# Patient Record
Sex: Female | Born: 1965 | Race: White | Hispanic: No | Smoking: Current every day smoker
Health system: Southern US, Community
[De-identification: ages and names within clinical notes are randomized; demographics above are authoritative.]

## PROBLEM LIST (undated history)

## (undated) DIAGNOSIS — F32A Depression, unspecified: Secondary | ICD-10-CM

## (undated) DIAGNOSIS — G5600 Carpal tunnel syndrome, unspecified upper limb: Secondary | ICD-10-CM

## (undated) DIAGNOSIS — F419 Anxiety disorder, unspecified: Secondary | ICD-10-CM

## (undated) DIAGNOSIS — K760 Fatty (change of) liver, not elsewhere classified: Secondary | ICD-10-CM

## (undated) DIAGNOSIS — I10 Essential (primary) hypertension: Secondary | ICD-10-CM

## (undated) DIAGNOSIS — I2510 Atherosclerotic heart disease of native coronary artery without angina pectoris: Secondary | ICD-10-CM

## (undated) DIAGNOSIS — M1990 Unspecified osteoarthritis, unspecified site: Secondary | ICD-10-CM

## (undated) DIAGNOSIS — F329 Major depressive disorder, single episode, unspecified: Secondary | ICD-10-CM

## (undated) DIAGNOSIS — E119 Type 2 diabetes mellitus without complications: Secondary | ICD-10-CM

## (undated) DIAGNOSIS — R011 Cardiac murmur, unspecified: Secondary | ICD-10-CM

## (undated) HISTORY — DX: Atherosclerotic heart disease of native coronary artery without angina pectoris: I25.10

## (undated) HISTORY — PX: CHOLECYSTECTOMY: SHX55

## (undated) HISTORY — PX: TUBAL LIGATION: SHX77

---

## 2015-08-12 DIAGNOSIS — Z139 Encounter for screening, unspecified: Secondary | ICD-10-CM

## 2015-10-22 NOTE — Congregational Nurse Program (Signed)
Congregational Nurse Program Note  Date of Encounter: 08/12/2015  Past Medical History: No past medical history on file.  Encounter Details:  Client [facility] 08/27/15 2:00, transportation arranged through [facility].  Client has multiple health issues and need a medical provider.  BP 139/90  P-  81. Pearletha AlfredJan Mak,RN CNP [phone]

## 2016-02-11 ENCOUNTER — Encounter (HOSPITAL_COMMUNITY): Payer: Self-pay | Admitting: Emergency Medicine

## 2016-02-11 ENCOUNTER — Emergency Department (HOSPITAL_COMMUNITY)
Admission: EM | Admit: 2016-02-11 | Discharge: 2016-02-11 | Disposition: A | Discharge status: Home / Self Care | Payer: Self-pay | Attending: Emergency Medicine | Admitting: Emergency Medicine

## 2016-02-11 DIAGNOSIS — Z79899 Other long term (current) drug therapy: Secondary | ICD-10-CM | POA: Insufficient documentation

## 2016-02-11 DIAGNOSIS — N939 Abnormal uterine and vaginal bleeding, unspecified: Secondary | ICD-10-CM | POA: Insufficient documentation

## 2016-02-11 DIAGNOSIS — Z7982 Long term (current) use of aspirin: Secondary | ICD-10-CM | POA: Insufficient documentation

## 2016-02-11 DIAGNOSIS — I10 Essential (primary) hypertension: Secondary | ICD-10-CM | POA: Insufficient documentation

## 2016-02-11 DIAGNOSIS — B9689 Other specified bacterial agents as the cause of diseases classified elsewhere: Secondary | ICD-10-CM

## 2016-02-11 DIAGNOSIS — N760 Acute vaginitis: Secondary | ICD-10-CM | POA: Insufficient documentation

## 2016-02-11 DIAGNOSIS — F17210 Nicotine dependence, cigarettes, uncomplicated: Secondary | ICD-10-CM | POA: Insufficient documentation

## 2016-02-11 DIAGNOSIS — N390 Urinary tract infection, site not specified: Secondary | ICD-10-CM | POA: Insufficient documentation

## 2016-02-11 DIAGNOSIS — Z791 Long term (current) use of non-steroidal anti-inflammatories (NSAID): Secondary | ICD-10-CM | POA: Insufficient documentation

## 2016-02-11 HISTORY — DX: Essential (primary) hypertension: I10

## 2016-02-11 HISTORY — DX: Depression, unspecified: F32.A

## 2016-02-11 HISTORY — DX: Anxiety disorder, unspecified: F41.9

## 2016-02-11 HISTORY — DX: Carpal tunnel syndrome, unspecified upper limb: G56.00

## 2016-02-11 HISTORY — DX: Major depressive disorder, single episode, unspecified: F32.9

## 2016-02-11 LAB — CBC WITH DIFFERENTIAL/PLATELET
BASOS PCT: 0 %
Basophils Absolute: 0 10*3/uL (ref 0.0–0.1)
EOS ABS: 0.2 10*3/uL (ref 0.0–0.7)
Eosinophils Relative: 2 %
HEMATOCRIT: 36.7 % (ref 36.0–46.0)
Hemoglobin: 12.5 g/dL (ref 12.0–15.0)
Lymphocytes Relative: 29 %
Lymphs Abs: 2.7 10*3/uL (ref 0.7–4.0)
MCH: 32.7 pg (ref 26.0–34.0)
MCHC: 34.1 g/dL (ref 30.0–36.0)
MCV: 96.1 fL (ref 78.0–100.0)
MONOS PCT: 8 %
Monocytes Absolute: 0.7 10*3/uL (ref 0.1–1.0)
NEUTROS PCT: 61 %
Neutro Abs: 5.6 10*3/uL (ref 1.7–7.7)
Platelets: 277 10*3/uL (ref 150–400)
RBC: 3.82 MIL/uL — AB (ref 3.87–5.11)
RDW: 12.9 % (ref 11.5–15.5)
WBC: 9.3 10*3/uL (ref 4.0–10.5)

## 2016-02-11 LAB — URINALYSIS, ROUTINE W REFLEX MICROSCOPIC
BILIRUBIN URINE: NEGATIVE
GLUCOSE, UA: NEGATIVE mg/dL
HGB URINE DIPSTICK: NEGATIVE
Ketones, ur: NEGATIVE mg/dL
Leukocytes, UA: NEGATIVE
Nitrite: NEGATIVE
PH: 6 (ref 5.0–8.0)
Protein, ur: NEGATIVE mg/dL
SPECIFIC GRAVITY, URINE: 1.02 (ref 1.005–1.030)

## 2016-02-11 LAB — COMPREHENSIVE METABOLIC PANEL
ALBUMIN: 3.8 g/dL (ref 3.5–5.0)
ALT: 26 U/L (ref 14–54)
ANION GAP: 8 (ref 5–15)
AST: 26 U/L (ref 15–41)
Alkaline Phosphatase: 39 U/L (ref 38–126)
BILIRUBIN TOTAL: 0.4 mg/dL (ref 0.3–1.2)
BUN: 10 mg/dL (ref 6–20)
CO2: 26 mmol/L (ref 22–32)
Calcium: 9.6 mg/dL (ref 8.9–10.3)
Chloride: 102 mmol/L (ref 101–111)
Creatinine, Ser: 0.88 mg/dL (ref 0.44–1.00)
GLUCOSE: 96 mg/dL (ref 65–99)
POTASSIUM: 3.7 mmol/L (ref 3.5–5.1)
Sodium: 136 mmol/L (ref 135–145)
TOTAL PROTEIN: 6.9 g/dL (ref 6.5–8.1)

## 2016-02-11 LAB — RAPID STREP SCREEN (MED CTR MEBANE ONLY): Streptococcus, Group A Screen (Direct): NEGATIVE

## 2016-02-11 LAB — WET PREP, GENITAL
Sperm: NONE SEEN
TRICH WET PREP: NONE SEEN
YEAST WET PREP: NONE SEEN

## 2016-02-11 LAB — PREGNANCY, URINE: Preg Test, Ur: NEGATIVE

## 2016-02-11 MED ORDER — ACETAMINOPHEN 500 MG PO TABS
1000.0000 mg | ORAL_TABLET | Freq: Once | ORAL | Status: AC
  Administered 2016-02-11: 1000 mg via ORAL
  Filled 2016-02-11: qty 2

## 2016-02-11 MED ORDER — NAPROXEN 250 MG PO TABS: 250.0000 mg | ORAL_TABLET | Freq: Two times a day (BID) | ORAL | 0 refills | Status: DC | PRN

## 2016-02-11 MED ORDER — METRONIDAZOLE 500 MG PO TABS
500.0000 mg | ORAL_TABLET | Freq: Once | ORAL | Status: AC
  Administered 2016-02-11: 500 mg via ORAL
  Filled 2016-02-11: qty 1

## 2016-02-11 MED ORDER — METRONIDAZOLE 500 MG PO TABS: 500.0000 mg | ORAL_TABLET | Freq: Two times a day (BID) | ORAL | 0 refills | Status: DC

## 2016-02-11 MED ORDER — IBUPROFEN 400 MG PO TABS
400.0000 mg | ORAL_TABLET | Freq: Once | ORAL | Status: AC
  Administered 2016-02-11: 400 mg via ORAL
  Filled 2016-02-11: qty 1

## 2016-02-11 NOTE — Discharge Instructions (Signed)
Take the prescriptions as directed.  Also take over the counter tylenol, as directed on packaging, as needed for discomfort.  Gargle with warm water several times per day to help with discomfort.  May also use over the counter sore throat pain medicines such as chloraseptic or sucrets, as directed on packaging, as needed for discomfort.  Call your regular medical and OB/GYN doctor tomorrow to schedule a follow up appointment within the next week.  Return to the Emergency Department immediately sooner if worsening.

## 2016-02-11 NOTE — ED Triage Notes (Signed)
Pt reports heavy vaginal bleeding, body aches, fever, headache,sore throat and carpal tunnel problems. NAD noted

## 2016-02-11 NOTE — ED Provider Notes (Signed)
[facility]-EMERGENCY DEPT Provider Note   CSN: [id number] Arrival date & time: 02/11/16  1717     History   Chief Complaint Chief Complaint  Patient presents with  . Vaginal Bleeding  . Flu like symptoms    HPI Renee Reilly is a 50 y.o. female.  HPI  Pt was seen at 2030. Per pt, c/o gradual onset and persistence of constant "heavy" vaginal bleeding for the past 2 weeks.  Denies pelvic pain, no dysuria/hematuria, no flank pain, no N/V/D, no fevers, no rash. Pt also c/o  Per pt, c/o gradual onset and persistence of constant sore throat, runny/stuffy nose, sinus congestion, and generalized body aches for the past few days.  Denies objective fevers, no rash, no CP/SOB, no cough.    Past Medical History:  Diagnosis Date  . Anxiety   . Carpal tunnel syndrome   . Depression   . Hypertension     There are no active problems to display for this patient.   Past Surgical History:  Procedure Laterality Date  . TUBAL LIGATION      OB History    Gravida Para Term Preterm AB Living   3 2 2   1      SAB TAB Ectopic Multiple Live Births   1               Home Medications    Prior to Admission medications   Medication Sig Start Date End Date Taking? Authorizing Provider  aspirin 81 MG chewable tablet Chew 81 mg by mouth daily.   Yes Historical Provider, MD  ibuprofen (ADVIL,MOTRIN) 800 MG tablet Take 800 mg by mouth every 8 (eight) hours as needed.   Yes Historical Provider, MD  lisinopril (PRINIVIL,ZESTRIL) 10 MG tablet Take 10 mg by mouth daily.   Yes Historical Provider, MD  promethazine (PHENERGAN) 25 MG tablet Take 25 mg by mouth every 6 (six) hours as needed for nausea or vomiting.   Yes Historical Provider, MD    Family History Family History  Problem Relation Age of Onset  . Cancer Mother   . Cancer Other     Social History Social History  Substance Use Topics  . Smoking status: Current Every Day Smoker    Packs/day: 0.25    Types: Cigarettes  . Smokeless  tobacco: Never Used  . Alcohol use Yes     Comment: occas     Allergies   Penicillins   Review of Systems Review of Systems ROS: Statement: All systems negative except as marked or noted in the HPI; Constitutional: Negative for fever and chills.+generalized body aches.; ; Eyes: Negative for eye pain, redness and discharge. ; ; ENMT: Negative for ear pain, hoarseness, +nasal congestion, sinus pressure and sore throat. ; ; Cardiovascular: Negative for chest pain, palpitations, diaphoresis, dyspnea and peripheral edema. ; ; Respiratory: Negative for cough, wheezing and stridor. ; ; Gastrointestinal: Negative for nausea, vomiting, diarrhea, abdominal pain, blood in stool, hematemesis, jaundice and rectal bleeding. . ; ; Genitourinary: Negative for dysuria, flank pain and hematuria. ; ; GYN:  No pelvic pain, +vaginal bleeding, no vaginal discharge, no vulvar pain. ;; Musculoskeletal: Negative for back pain and neck pain. Negative for swelling and trauma.; ; Skin: Negative for pruritus, rash, abrasions, blisters, bruising and skin lesion.; ; Neuro: Negative for headache, lightheadedness and neck stiffness. Negative for weakness, altered level of consciousness, altered mental status, extremity weakness, paresthesias, involuntary movement, seizure and syncope.      Physical Exam Updated Vital Signs BP 120/70 (  BP Location: Left Arm)   Pulse 87   Temp 97.6 F (36.4 C) (Oral)   Resp 20   Ht 5\' 6"  (1.676 m)   Wt 209 lb (94.8 kg)   LMP 02/11/2016   SpO2 97%   BMI 33.73 kg/m   Physical Exam 2035: Physical examination:  Nursing notes reviewed; Vital signs and O2 SAT reviewed;  Constitutional: Well developed, Well nourished, Well hydrated, In no acute distress; Head:  Normocephalic, atraumatic; Eyes: EOMI, PERRL, No scleral icterus; ENMT: TM's clear bilat. +edemetous nasal turbinates bilat with clear rhinorrhea. Mouth and pharynx without lesions. No tonsillar exudates. No intra-oral edema. No  submandibular or sublingual edema. No hoarse voice, no drooling, no stridor. No pain with manipulation of larynx. No trismus. Mouth and pharynx normal, Mucous membranes moist; Neck: Supple, Full range of motion, No lymphadenopathy; Cardiovascular: Regular rate and rhythm, No murmur, rub, or gallop; Respiratory: Breath sounds clear & equal bilaterally, No rales, rhonchi, wheezes.  Speaking full sentences with ease, Normal respiratory effort/excursion; Chest: Nontender, Movement normal; Abdomen: Soft, Nontender, Nondistended, Normal bowel sounds; Genitourinary: No CVA tenderness. Pelvic exam performed with permission of pt and female ED tech assist during exam.  External genitalia w/o lesions. Vaginal vault with small amount white discharge, no blood.  Cervix w/o lesions, not friable, GC/chlam and wet prep obtained and sent to lab.  Bimanual exam w/o CMT or adnexal tenderness. +mild suprapubic tenderness.;;; Extremities: Pulses normal, No tenderness, No edema, No calf edema or asymmetry.; Neuro: AA&Ox3, Major CN grossly intact.  Speech clear. No gross focal motor or sensory deficits in extremities.; Skin: Color normal, Warm, Dry.   ED Treatments / Results  Labs (all labs ordered are listed, but only abnormal results are displayed)   EKG  EKG Interpretation None       Radiology   Procedures Procedures (including critical care time)  Medications Ordered in ED Medications - No data to display   Initial Impression / Assessment and Plan / ED Course  I have reviewed the triage vital signs and the nursing notes.  Pertinent labs & imaging results that were available during my care of the patient were reviewed by me and considered in my medical decision making (see chart for details).  MDM Reviewed: previous chart, nursing note and vitals Interpretation: labs   Results for orders placed or performed during the hospital encounter of 02/11/16  Wet prep, genital  Result Value Ref Range    Yeast Wet Prep HPF POC NONE SEEN NONE SEEN   Trich, Wet Prep NONE SEEN NONE SEEN   Clue Cells Wet Prep HPF POC PRESENT (A) NONE SEEN   WBC, Wet Prep HPF POC MODERATE (A) NONE SEEN   Sperm NONE SEEN   Rapid strep screen  Result Value Ref Range   Streptococcus, Group A Screen (Direct) NEGATIVE NEGATIVE  CBC with Differential  Result Value Ref Range   WBC 9.3 4.0 - 10.5 K/uL   RBC 3.82 (L) 3.87 - 5.11 MIL/uL   Hemoglobin 12.5 12.0 - 15.0 g/dL   HCT 47.836.7 29.536.0 - 62.146.0 %   MCV 96.1 78.0 - 100.0 fL   MCH 32.7 26.0 - 34.0 pg   MCHC 34.1 30.0 - 36.0 g/dL   RDW 30.812.9 65.711.5 - 84.615.5 %   Platelets 277 150 - 400 K/uL   Neutrophils Relative % 61 %   Neutro Abs 5.6 1.7 - 7.7 K/uL   Lymphocytes Relative 29 %   Lymphs Abs 2.7 0.7 - 4.0 K/uL  Monocytes Relative 8 %   Monocytes Absolute 0.7 0.1 - 1.0 K/uL   Eosinophils Relative 2 %   Eosinophils Absolute 0.2 0.0 - 0.7 K/uL   Basophils Relative 0 %   Basophils Absolute 0.0 0.0 - 0.1 K/uL   WBC Morphology VACUOLATED NEUTROPHILS    RBC Morphology ROULEAUX    Smear Review LARGE PLATELETS PRESENT   Comprehensive metabolic panel  Result Value Ref Range   Sodium 136 135 - 145 mmol/L   Potassium 3.7 3.5 - 5.1 mmol/L   Chloride 102 101 - 111 mmol/L   CO2 26 22 - 32 mmol/L   Glucose, Bld 96 65 - 99 mg/dL   BUN 10 6 - 20 mg/dL   Creatinine, Ser 4.78 0.44 - 1.00 mg/dL   Calcium 9.6 8.9 - 29.5 mg/dL   Total Protein 6.9 6.5 - 8.1 g/dL   Albumin 3.8 3.5 - 5.0 g/dL   AST 26 15 - 41 U/L   ALT 26 14 - 54 U/L   Alkaline Phosphatase 39 38 - 126 U/L   Total Bilirubin 0.4 0.3 - 1.2 mg/dL   GFR calc non Af Amer >60 >60 mL/min   GFR calc Af Amer >60 >60 mL/min   Anion gap 8 5 - 15  Pregnancy, urine  Result Value Ref Range   Preg Test, Ur NEGATIVE NEGATIVE  Urinalysis, Routine w reflex microscopic  Result Value Ref Range   Color, Urine YELLOW YELLOW   APPearance CLEAR CLEAR   Specific Gravity, Urine 1.020 1.005 - 1.030   pH 6.0 5.0 - 8.0   Glucose, UA  NEGATIVE NEGATIVE mg/dL   Hgb urine dipstick NEGATIVE NEGATIVE   Bilirubin Urine NEGATIVE NEGATIVE   Ketones, ur NEGATIVE NEGATIVE mg/dL   Protein, ur NEGATIVE NEGATIVE mg/dL   Nitrite NEGATIVE NEGATIVE   Leukocytes, UA NEGATIVE NEGATIVE    2125:  No evidence of vaginal bleeding on exam. Tx for BV and URI. Dx and testing d/w pt.  Questions answered.  Verb understanding, agreeable to d/c home with outpt f/u.    Final Clinical Impressions(s) / ED Diagnoses   Final diagnoses:  None    New Prescriptions New Prescriptions   No medications on file     Samuel Jester, DO 02/15/16 1700

## 2016-02-11 NOTE — ED Notes (Signed)
ED Provider at bedside. 

## 2016-02-13 LAB — CULTURE, GROUP A STREP (THRC)

## 2016-02-17 LAB — GC/CHLAMYDIA PROBE AMP (~~LOC~~) NOT AT ARMC
CHLAMYDIA, DNA PROBE: NEGATIVE
NEISSERIA GONORRHEA: NEGATIVE

## 2019-08-07 LAB — RESULTS CONSOLE HPV: CHL HPV: NEGATIVE

## 2019-08-07 LAB — HM PAP SMEAR: HM Pap smear: NEGATIVE

## 2019-10-17 LAB — HM MAMMOGRAPHY

## 2020-11-05 ENCOUNTER — Ambulatory Visit: Payer: Self-pay | Admitting: Family Medicine

## 2020-11-27 ENCOUNTER — Other Ambulatory Visit: Payer: Self-pay

## 2020-11-27 ENCOUNTER — Ambulatory Visit (INDEPENDENT_AMBULATORY_CARE_PROVIDER_SITE_OTHER): Payer: 59 | Admitting: Family Medicine

## 2020-11-27 ENCOUNTER — Encounter: Payer: Self-pay | Admitting: Family Medicine

## 2020-11-27 VITALS — BP 91/66 | HR 87 | Temp 98.0°F | Ht 66.0 in | Wt 204.0 lb

## 2020-11-27 DIAGNOSIS — M5441 Lumbago with sciatica, right side: Secondary | ICD-10-CM

## 2020-11-27 DIAGNOSIS — E1159 Type 2 diabetes mellitus with other circulatory complications: Secondary | ICD-10-CM

## 2020-11-27 DIAGNOSIS — K219 Gastro-esophageal reflux disease without esophagitis: Secondary | ICD-10-CM

## 2020-11-27 DIAGNOSIS — E1140 Type 2 diabetes mellitus with diabetic neuropathy, unspecified: Secondary | ICD-10-CM | POA: Insufficient documentation

## 2020-11-27 DIAGNOSIS — E785 Hyperlipidemia, unspecified: Secondary | ICD-10-CM | POA: Insufficient documentation

## 2020-11-27 DIAGNOSIS — Z1231 Encounter for screening mammogram for malignant neoplasm of breast: Secondary | ICD-10-CM

## 2020-11-27 DIAGNOSIS — M5442 Lumbago with sciatica, left side: Secondary | ICD-10-CM | POA: Insufficient documentation

## 2020-11-27 DIAGNOSIS — E1141 Type 2 diabetes mellitus with diabetic mononeuropathy: Secondary | ICD-10-CM

## 2020-11-27 DIAGNOSIS — G8929 Other chronic pain: Secondary | ICD-10-CM

## 2020-11-27 DIAGNOSIS — E1169 Type 2 diabetes mellitus with other specified complication: Secondary | ICD-10-CM | POA: Diagnosis not present

## 2020-11-27 DIAGNOSIS — I152 Hypertension secondary to endocrine disorders: Secondary | ICD-10-CM

## 2020-11-27 DIAGNOSIS — R011 Cardiac murmur, unspecified: Secondary | ICD-10-CM | POA: Insufficient documentation

## 2020-11-27 DIAGNOSIS — G4700 Insomnia, unspecified: Secondary | ICD-10-CM | POA: Insufficient documentation

## 2020-11-27 DIAGNOSIS — Z1212 Encounter for screening for malignant neoplasm of rectum: Secondary | ICD-10-CM

## 2020-11-27 DIAGNOSIS — Z1211 Encounter for screening for malignant neoplasm of colon: Secondary | ICD-10-CM

## 2020-11-27 DIAGNOSIS — E559 Vitamin D deficiency, unspecified: Secondary | ICD-10-CM

## 2020-11-27 DIAGNOSIS — F5101 Primary insomnia: Secondary | ICD-10-CM

## 2020-11-27 LAB — BAYER DCA HB A1C WAIVED: HB A1C (BAYER DCA - WAIVED): 7 % — ABNORMAL HIGH (ref 4.8–5.6)

## 2020-11-27 NOTE — Patient Instructions (Signed)

## 2020-11-27 NOTE — Progress Notes (Signed)
Subjective:  Patient ID: Renee Reilly, female    DOB: Apr 18, 1965, 55 y.o.   MRN: [id number]  Patient Care Team: Baruch Gouty, FNP as PCP - General (Family Medicine)   Chief Complaint:  New Patient (Initial Visit) (Establish care/Neuropathy, multiple sites/Back pain/Pain referral)   HPI: Renee Reilly is a 55 y.o. female presenting on 11/27/2020 for New Patient (Initial Visit) (Establish care/Neuropathy, multiple sites/Back pain/Pain referral)   Pt presents today to establish care with PCP. She has been going to the health department for her care. Database is incomplete and records have been requested. She states her PAP is up to date. She has not had a mammogram in the last year, and has never had a colonoscopy. 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 She does not check her blood sugar on a regular basis and does not know what her las A1C was. She is on metformin. She denies polyuria, polyphagia, or polydipsia. States her blood pressure is usually well controlled. No chest pain, palpitations, dyspnea, dizziness, visual changes, headaches, confusion, weakness, leg swelling, or syncope. She is on statin and ASA therapy, tolerating well. She is on ACEi therapy. Her lower back pain and neuropathy are her biggest concern as she has daily pain that has been ongoing since 2009. She denies prior back injury, states pain is just due to arthritis. She reports her GERD is well controlled. No voice change, hemoptysis, dysphagia, cough, sore throat, melena, or hematochezia. She is actively seeking disability and is not working.    Relevant past medical, surgical, family, and social history reviewed and updated as indicated.  Allergies and medications reviewed and updated. Data reviewed: Chart in Epic.   Past Medical History:  Diagnosis Date   Anxiety    Carpal tunnel syndrome    Depression    Hypertension      Past Surgical History:  Procedure Laterality Date   TUBAL LIGATION      Social History   Socioeconomic History   Marital status: Widowed    Spouse name: Not on file   Number of children: Not on file   Years of education: Not on file   Highest education level: Not on file  Occupational History   Not on file  Tobacco Use   Smoking status: Every Day    Packs/day: 0.25    Types: Cigarettes   Smokeless tobacco: Never  Substance and Sexual Activity   Alcohol use: Yes    Comment: occas   Drug use: No   Sexual activity: Yes    Birth control/protection: Surgical  Other Topics Concern   Not on file  Social History Narrative   Not on file   Social Determinants of Health   Financial Resource Strain: Not on file  Food Insecurity: Not on file  Transportation Needs: Not on file  Physical Activity: Not on file  Stress: Not on file  Social Connections: Not on file  Intimate Partner Violence: Not on file    Outpatient Encounter Medications as of 11/27/2020  Medication Sig   aspirin 81 MG chewable tablet Chew 81 mg by mouth daily.   atorvastatin (LIPITOR) 40 MG tablet Take by mouth.   Cholecalciferol (VITAMIN D3) 125 MCG (5000 UT) TABS Take by mouth.   citalopram (CELEXA) 20 MG tablet Take 20 mg by mouth daily.   cyclobenzaprine (FLEXERIL) 10 MG tablet Take by mouth.   diphenhydrAMINE (BANOPHEN) 25 MG tablet Take  25 mg by mouth every 6 (six) hours as needed.   DULoxetine (CYMBALTA) 60 MG capsule Take 60 mg by mouth daily.   fenofibrate (TRICOR) 145 MG tablet Take by mouth.   gabapentin (NEURONTIN) 300 MG capsule Take 300 mg by mouth 3 (three) times daily.   lisinopril (PRINIVIL,ZESTRIL) 10 MG tablet Take 10 mg by mouth daily.   metFORMIN (GLUCOPHAGE) 500 MG tablet take 1 tablet by oral route every morning and 1/2 tablet every evening   Multiple Vitamin (MULTIVITAMIN) tablet Take 1 tablet by mouth daily.   omeprazole (PRILOSEC) 20 MG capsule Take by mouth.   traZODone  (DESYREL) 100 MG tablet Take by mouth.   [DISCONTINUED] ibuprofen (ADVIL,MOTRIN) 800 MG tablet Take 800 mg by mouth every 8 (eight) hours as needed.   [DISCONTINUED] promethazine (PHENERGAN) 25 MG tablet Take 25 mg by mouth every 6 (six) hours as needed for nausea or vomiting.   [DISCONTINUED] metroNIDAZOLE (FLAGYL) 500 MG tablet Take 1 tablet (500 mg total) by mouth 2 (two) times daily.   [DISCONTINUED] naproxen (NAPROSYN) 250 MG tablet Take 1 tablet (250 mg total) by mouth 2 (two) times daily as needed for mild pain or moderate pain (take with food).   No facility-administered encounter medications on file as of 11/27/2020.    Allergies  Allergen Reactions   Penicillins Anaphylaxis    Review of Systems  Constitutional:  Negative for activity change, appetite change, chills, diaphoresis, fatigue, fever and unexpected weight change.  HENT: Negative.    Eyes: Negative.  Negative for photophobia and visual disturbance.  Respiratory:  Negative for cough, chest tightness and shortness of breath.   Cardiovascular:  Negative for chest pain, palpitations and leg swelling.  Gastrointestinal:  Negative for abdominal distention, abdominal pain, anal bleeding, blood in stool, constipation, diarrhea, nausea, rectal pain and vomiting.  Endocrine: Negative.  Negative for cold intolerance, heat intolerance, polydipsia, polyphagia and polyuria.  Genitourinary:  Negative for decreased urine volume, difficulty urinating, dysuria, frequency and urgency.  Musculoskeletal:  Positive for arthralgias, back pain and myalgias. Negative for gait problem, joint swelling, neck pain and neck stiffness.  Skin: Negative.   Allergic/Immunologic: Negative.   Neurological:  Negative for dizziness, tremors, seizures, syncope, facial asymmetry, speech difficulty, weakness, light-headedness, numbness and headaches.  Hematological: Negative.   Psychiatric/Behavioral:  Positive for sleep disturbance. Negative for confusion,  hallucinations and suicidal ideas.   All other systems reviewed and are negative.      Objective:  BP 91/66   Pulse 87   Temp 98 F (36.7 C)   Ht 5' 6" (1.676 m)   Wt 204 lb (92.5 kg)   LMP 11/28/2018 (Approximate) Comment: 2 tears ago approx.  SpO2 96%   BMI 32.93 kg/m    Wt Readings from Last 3 Encounters:  11/27/20 204 lb (92.5 kg)  02/11/16 209 lb (94.8 kg)    Physical Exam Vitals and nursing note reviewed.  Constitutional:      General: She is not in acute distress.    Appearance: Normal appearance. She is well-developed and well-groomed. She is obese. She is not ill-appearing, toxic-appearing or diaphoretic.  HENT:     Head: Normocephalic and atraumatic.     Jaw: There is normal jaw occlusion.     Right Ear: Hearing normal.     Left Ear: Hearing normal.     Nose: Nose normal.     Mouth/Throat:     Lips: Pink.     Mouth: Mucous membranes are moist.  Pharynx: Oropharynx is clear. Uvula midline.  Eyes:     General: Lids are normal.     Extraocular Movements: Extraocular movements intact.     Conjunctiva/sclera: Conjunctivae normal.     Pupils: Pupils are equal, round, and reactive to light.  Neck:     Thyroid: No thyroid mass, thyromegaly or thyroid tenderness.     Vascular: No carotid bruit or JVD.     Trachea: Trachea and phonation normal.  Cardiovascular:     Rate and Rhythm: Normal rate and regular rhythm.     Chest Wall: PMI is not displaced.     Pulses: Normal pulses.     Heart sounds: Murmur heard.  Systolic murmur is present with a grade of 3/6.    No friction rub. No gallop.  Pulmonary:     Effort: Pulmonary effort is normal. No respiratory distress.     Breath sounds: Normal breath sounds. No wheezing, rhonchi or rales.  Chest:     Chest wall: No tenderness.  Abdominal:     General: Bowel sounds are normal. There is no distension or abdominal bruit.     Palpations: Abdomen is soft. There is no hepatomegaly or splenomegaly.     Tenderness:  There is no abdominal tenderness. There is no right CVA tenderness or left CVA tenderness.     Hernia: No hernia is present.  Musculoskeletal:     Cervical back: Normal range of motion and neck supple.     Thoracic back: Normal.     Lumbar back: Tenderness present. No swelling, edema, deformity, signs of trauma, lacerations, spasms or bony tenderness. Decreased range of motion. Negative right straight leg raise test and negative left straight leg raise test. No scoliosis.     Right hip: Normal.     Left hip: Normal.     Right upper leg: Normal.     Left upper leg: Normal.     Right lower leg: No edema.     Left lower leg: No edema.  Lymphadenopathy:     Cervical: No cervical adenopathy.  Skin:    General: Skin is warm and dry.     Capillary Refill: Capillary refill takes less than 2 seconds.     Coloration: Skin is not cyanotic, jaundiced or pale.     Findings: No rash.  Neurological:     General: No focal deficit present.     Mental Status: She is alert and oriented to person, place, and time.     Cranial Nerves: Cranial nerves are intact.     Sensory: Sensation is intact.     Motor: Motor function is intact. No weakness.     Coordination: Coordination is intact.     Gait: Gait is intact. Gait normal.     Deep Tendon Reflexes: Reflexes are normal and symmetric.  Psychiatric:        Attention and Perception: Attention and perception normal.        Mood and Affect: Mood and affect normal.        Speech: Speech normal.        Behavior: Behavior normal. Behavior is cooperative.        Thought Content: Thought content normal.        Cognition and Memory: Cognition and memory normal.        Judgment: Judgment normal.    Results for orders placed or performed during the hospital encounter of 02/11/16  Wet prep, genital   Specimen: Genital  Result Value Ref Range  Yeast Wet Prep HPF POC NONE SEEN NONE SEEN   Trich, Wet Prep NONE SEEN NONE SEEN   Clue Cells Wet Prep HPF POC  PRESENT (A) NONE SEEN   WBC, Wet Prep HPF POC MODERATE (A) NONE SEEN   Sperm NONE SEEN   Rapid strep screen   Specimen: Other  Result Value Ref Range   Streptococcus, Group A Screen (Direct) NEGATIVE NEGATIVE  Culture, group A strep   Specimen: Throat  Result Value Ref Range   Specimen Description THROAT    Special Requests NONE Reflexed from [id number]    Culture MODERATE STREPTOCOCCUS,BETA HEMOLYTIC NOT GROUP A    Report Status 02/13/2016 FINAL   CBC with Differential  Result Value Ref Range   WBC 9.3 4.0 - 10.5 K/uL   RBC 3.82 (L) 3.87 - 5.11 MIL/uL   Hemoglobin 12.5 12.0 - 15.0 g/dL   HCT 36.7 36.0 - 46.0 %   MCV 96.1 78.0 - 100.0 fL   MCH 32.7 26.0 - 34.0 pg   MCHC 34.1 30.0 - 36.0 g/dL   RDW 12.9 11.5 - 15.5 %   Platelets 277 150 - 400 K/uL   Neutrophils Relative % 61 %   Neutro Abs 5.6 1.7 - 7.7 K/uL   Lymphocytes Relative 29 %   Lymphs Abs 2.7 0.7 - 4.0 K/uL   Monocytes Relative 8 %   Monocytes Absolute 0.7 0.1 - 1.0 K/uL   Eosinophils Relative 2 %   Eosinophils Absolute 0.2 0.0 - 0.7 K/uL   Basophils Relative 0 %   Basophils Absolute 0.0 0.0 - 0.1 K/uL   WBC Morphology VACUOLATED NEUTROPHILS    RBC Morphology ROULEAUX    Smear Review LARGE PLATELETS PRESENT   Comprehensive metabolic panel  Result Value Ref Range   Sodium 136 135 - 145 mmol/L   Potassium 3.7 3.5 - 5.1 mmol/L   Chloride 102 101 - 111 mmol/L   CO2 26 22 - 32 mmol/L   Glucose, Bld 96 65 - 99 mg/dL   BUN 10 6 - 20 mg/dL   Creatinine, Ser 0.88 0.44 - 1.00 mg/dL   Calcium 9.6 8.9 - 10.3 mg/dL   Total Protein 6.9 6.5 - 8.1 g/dL   Albumin 3.8 3.5 - 5.0 g/dL   AST 26 15 - 41 U/L   ALT 26 14 - 54 U/L   Alkaline Phosphatase 39 38 - 126 U/L   Total Bilirubin 0.4 0.3 - 1.2 mg/dL   GFR calc non Af Amer >60 >60 mL/min   GFR calc Af Amer >60 >60 mL/min   Anion gap 8 5 - 15  Pregnancy, urine  Result Value Ref Range   Preg Test, Ur NEGATIVE NEGATIVE  Urinalysis, Routine w reflex microscopic  Result  Value Ref Range   Color, Urine YELLOW YELLOW   APPearance CLEAR CLEAR   Specific Gravity, Urine 1.020 1.005 - 1.030   pH 6.0 5.0 - 8.0   Glucose, UA NEGATIVE NEGATIVE mg/dL   Hgb urine dipstick NEGATIVE NEGATIVE   Bilirubin Urine NEGATIVE NEGATIVE   Ketones, ur NEGATIVE NEGATIVE mg/dL   Protein, ur NEGATIVE NEGATIVE mg/dL   Nitrite NEGATIVE NEGATIVE   Leukocytes, UA NEGATIVE NEGATIVE  GC/Chlamydia probe amp ()not at [facility]  Result Value Ref Range   Chlamydia Negative    Neisseria Gonorrhea Negative      EKG: SR, 79, PR 184 ms, QT 386 ms, no acute ST-T changes, no ectopy, incomplete BBB. Monia Pouch, FNP-C.   Pertinent labs &  imaging results that were available during my care of the patient were reviewed by me and considered in my medical decision making.  Assessment & Plan:  Kellen was seen today for new patient (initial visit).  Diagnoses and all orders for this visit:  Type 2 diabetes mellitus with other specified complication, without long-term current use of insulin (HCC) A1C 7.0 today. Diet and exercise encouraged. Will repeat in 3 months. Other labs pending. Pt aware she needs to have eye exam and dental exam. New cardiac murmur noted, will place referral to cardiology due to comorbidities.  -     CBC with Differential/Platelet -     CMP14+EGFR -     Lipid panel -     Thyroid Panel With TSH -     Microalbumin / creatinine urine ratio -     Bayer DCA Hb A1c Waived -     Ambulatory referral to Cardiology -     EKG 12-Lead  Hypertension associated with diabetes ([place]) Well controlled on current regimen, continue. Labs pending. DASH diet and exercise encouraged.  -     CBC with Differential/Platelet -     CMP14+EGFR -     Lipid panel -     Thyroid Panel With TSH -     Microalbumin / creatinine urine ratio -     Bayer DCA Hb A1c Waived -     Ambulatory referral to Cardiology -     EKG 12-Lead  Hyperlipidemia associated with type 2 diabetes mellitus  ([place]) Labs pending. Diet and exercise encouraged. Continue current regimen.  -     CBC with Differential/Platelet -     CMP14+EGFR -     Lipid panel -     Thyroid Panel With TSH -     Microalbumin / creatinine urine ratio -     Ambulatory referral to Cardiology -     EKG 12-Lead  Diabetic mononeuropathy associated with type 2 diabetes mellitus ([place]) Labs pending. Referral to pain management placed.  -     CBC with Differential/Platelet -     CMP14+EGFR -     Lipid panel -     Thyroid Panel With TSH -     Microalbumin / creatinine urine ratio -     Bayer DCA Hb A1c Waived  Chronic bilateral low back pain with bilateral sciatica Referral to pain management placed.  -     Ambulatory referral to Pain Clinic  Vitamin D deficiency Labs pending. Will adjust repletion therapy if warranted.  -     VITAMIN D 25 Hydroxy (Vit-D Deficiency, Fractures)  Gastroesophageal reflux disease without esophagitis No red flags present. Diet discussed. Avoid fried, spicy, fatty, greasy, and acidic foods. Avoid caffeine, nicotine, and alcohol. Do not eat 2-3 hours before bedtime and stay upright for at least 1-2 hours after eating. Eat small frequent meals. Avoid NSAID's like motrin and aleve. Medications as prescribed. Report any new or worsening symptoms. Follow up as discussed or sooner if needed.   -     CBC with Differential/Platelet  Primary insomnia Has been taking benadryl and trazodone for insomnia. Sleep hygiene discussed in detail.  Cardiac murmur EKG without acute changes but abnormal. New murmur noted. Will refer to cardiology.  -     Ambulatory referral to Cardiology -     EKG 12-Lead  Encounter for screening mammogram for malignant neoplasm of breast -     MM 3D SCREEN BREAST BILATERAL; Future  Screening for colorectal cancer -  Cologuard     Continue all other maintenance medications.  Follow up plan: Return in about 3 months (around 02/27/2021), or if symptoms worsen or fail  to improve.   Continue healthy lifestyle choices, including diet (rich in fruits, vegetables, and lean proteins, and low in salt and simple carbohydrates) and exercise (at least 30 minutes of moderate physical activity daily).  Educational handout given for DM  The above assessment and management plan was discussed with the patient. The patient verbalized understanding of and has agreed to the management plan. Patient is aware to call the clinic if they develop any new symptoms or if symptoms persist or worsen. Patient is aware when to return to the clinic for a follow-up visit. Patient educated on when it is appropriate to go to the emergency department.   Monia Pouch, FNP-C [facility] [phone]

## 2020-11-28 LAB — CMP14+EGFR
ALT: 32 IU/L (ref 0–32)
AST: 37 IU/L (ref 0–40)
Albumin/Globulin Ratio: 1.6 (ref 1.2–2.2)
Albumin: 4.3 g/dL (ref 3.8–4.9)
Alkaline Phosphatase: 65 IU/L (ref 44–121)
BUN/Creatinine Ratio: 11 (ref 9–23)
BUN: 8 mg/dL (ref 6–24)
Bilirubin Total: 0.3 mg/dL (ref 0.0–1.2)
CO2: 23 mmol/L (ref 20–29)
Calcium: 10 mg/dL (ref 8.7–10.2)
Chloride: 100 mmol/L (ref 96–106)
Creatinine, Ser: 0.74 mg/dL (ref 0.57–1.00)
Globulin, Total: 2.7 g/dL (ref 1.5–4.5)
Glucose: 101 mg/dL — ABNORMAL HIGH (ref 70–99)
Potassium: 4.8 mmol/L (ref 3.5–5.2)
Sodium: 136 mmol/L (ref 134–144)
Total Protein: 7 g/dL (ref 6.0–8.5)
eGFR: 96 mL/min/{1.73_m2} (ref >59)

## 2020-11-28 LAB — CBC WITH DIFFERENTIAL/PLATELET
Basophils Absolute: 0 10*3/uL (ref 0.0–0.2)
Basos: 1 %
EOS (ABSOLUTE): 0.1 10*3/uL (ref 0.0–0.4)
Eos: 2 %
Hematocrit: 39.4 % (ref 34.0–46.6)
Hemoglobin: 13.3 g/dL (ref 11.1–15.9)
Immature Grans (Abs): 0 10*3/uL (ref 0.0–0.1)
Immature Granulocytes: 0 %
Lymphocytes Absolute: 3.5 10*3/uL — ABNORMAL HIGH (ref 0.7–3.1)
Lymphs: 45 %
MCH: 32.1 pg (ref 26.6–33.0)
MCHC: 33.8 g/dL (ref 31.5–35.7)
MCV: 95 fL (ref 79–97)
Monocytes Absolute: 0.6 10*3/uL (ref 0.1–0.9)
Monocytes: 7 %
Neutrophils Absolute: 3.7 10*3/uL (ref 1.4–7.0)
Neutrophils: 45 %
Platelets: 283 10*3/uL (ref 150–450)
RBC: 4.14 x10E6/uL (ref 3.77–5.28)
RDW: 12 % (ref 11.7–15.4)
WBC: 7.9 10*3/uL (ref 3.4–10.8)

## 2020-11-28 LAB — LIPID PANEL
Chol/HDL Ratio: 3.4 ratio (ref 0.0–4.4)
Cholesterol, Total: 125 mg/dL (ref 100–199)
HDL: 37 mg/dL — ABNORMAL LOW (ref >39)
LDL Chol Calc (NIH): 54 mg/dL (ref 0–99)
Triglycerides: 211 mg/dL — ABNORMAL HIGH (ref 0–149)
VLDL Cholesterol Cal: 34 mg/dL (ref 5–40)

## 2020-11-28 LAB — THYROID PANEL WITH TSH
Free Thyroxine Index: 2.4 (ref 1.2–4.9)
T3 Uptake Ratio: 24 % (ref 24–39)
T4, Total: 10.2 ug/dL (ref 4.5–12.0)
TSH: 1.76 u[IU]/mL (ref 0.450–4.500)

## 2020-11-28 LAB — VITAMIN D 25 HYDROXY (VIT D DEFICIENCY, FRACTURES): Vit D, 25-Hydroxy: 79.6 ng/mL (ref 30.0–100.0)

## 2020-11-29 LAB — MICROALBUMIN / CREATININE URINE RATIO
Creatinine, Urine: 55.6 mg/dL
Microalb/Creat Ratio: <5 mg/g creat (ref 0–29)
Microalbumin, Urine: <3 ug/mL

## 2020-12-07 ENCOUNTER — Telehealth: Payer: Self-pay | Admitting: Family Medicine

## 2020-12-07 NOTE — Telephone Encounter (Signed)
Pt called regarding referral to see Dr Gerilyn Pilgrim at [facility] for pain.  Says their office called her and told her that they are not in network with her insurance ([employer]).  Pt requesting that we send referral somewhere else that does take her insurance.

## 2020-12-08 ENCOUNTER — Other Ambulatory Visit: Payer: Self-pay | Admitting: Family Medicine

## 2020-12-09 NOTE — Telephone Encounter (Signed)
R/C from 10-17. Never heard back from anyone.

## 2020-12-10 ENCOUNTER — Telehealth: Payer: Self-pay | Admitting: Family Medicine

## 2020-12-10 ENCOUNTER — Encounter: Payer: Self-pay | Admitting: Cardiology

## 2020-12-10 ENCOUNTER — Other Ambulatory Visit (HOSPITAL_COMMUNITY)
Admission: RE | Admit: 2020-12-10 | Discharge: 2020-12-10 | Disposition: A | Discharge status: Home / Self Care | Payer: 59 | Source: Ambulatory Visit | Attending: Cardiology | Admitting: Cardiology

## 2020-12-10 ENCOUNTER — Other Ambulatory Visit: Payer: Self-pay

## 2020-12-10 ENCOUNTER — Ambulatory Visit (INDEPENDENT_AMBULATORY_CARE_PROVIDER_SITE_OTHER): Payer: 59 | Admitting: Cardiology

## 2020-12-10 VITALS — BP 118/78 | HR 91 | Ht 65.0 in | Wt 204.0 lb

## 2020-12-10 DIAGNOSIS — R011 Cardiac murmur, unspecified: Secondary | ICD-10-CM | POA: Insufficient documentation

## 2020-12-10 LAB — BASIC METABOLIC PANEL
Anion gap: 12 (ref 5–15)
BUN: 8 mg/dL (ref 6–20)
CO2: 25 mmol/L (ref 22–32)
Calcium: 9.3 mg/dL (ref 8.9–10.3)
Chloride: 97 mmol/L — ABNORMAL LOW (ref 98–111)
Creatinine, Ser: 0.65 mg/dL (ref 0.44–1.00)
GFR, Estimated: >60 mL/min (ref >60)
Glucose, Bld: 146 mg/dL — ABNORMAL HIGH (ref 70–99)
Potassium: 3.8 mmol/L (ref 3.5–5.1)
Sodium: 134 mmol/L — ABNORMAL LOW (ref 135–145)

## 2020-12-10 MED ORDER — METOPROLOL TARTRATE 100 MG PO TABS: 100.0000 mg | ORAL_TABLET | Freq: Once | ORAL | 0 refills | Status: DC

## 2020-12-10 NOTE — Progress Notes (Signed)
Cardiology CONSULT Note    Date:  12/10/2020   ID:  Renee Reilly, DOB 05/26/1965, MRN [id number]  PCP:  Sonny Masters, FNP  Cardiologist:  Armanda Magic, MD   Chief Complaint  Patient presents with   New Patient (Initial Visit)    Heart murmur     History of Present Illness:  Renee Reilly is a 55 y.o. female who is being seen today for the evaluation of heart murmur at the request of Rakes, Doralee Albino, FNP.  This is a 55 year old female with a history of depression and hypertension who recently was seen by her PCP and diagnosed with a heart murmur.  She thinks she was told she had a heart murmur in 3rd grade but never had a workup.  She is now referred for further evaluation.  Recently she has been having chest pain that comes and goes.  She may go a few days with no pain and then it comes back. It is nonexertional but also occurs with emotional and physical exertion.  The discomfort is a sharp pain with radiation into her left arm.  She has chronic neck and shoulder pain.  She will get nauseated and have diaphoresis with the SOB.  She has chronic DOE that has been going on for a few months.  She denies any PND, orthopnea, palpitations or syncope.  She occasionally has some LE edema related to LE neuropathy.  She smokes currently 1/2ppd.    Past Medical History:  Diagnosis Date   Anxiety    Carpal tunnel syndrome    Depression    Hypertension     Past Surgical History:  Procedure Laterality Date   TUBAL LIGATION      Current Medications: Current Meds  Medication Sig   aspirin 81 MG chewable tablet Chew 81 mg by mouth daily.   atorvastatin (LIPITOR) 40 MG tablet Take by mouth.   Cholecalciferol (VITAMIN D3) 125 MCG (5000 UT) TABS Take by mouth.   citalopram (CELEXA) 20 MG tablet Take 20 mg by mouth daily.   cyclobenzaprine (FLEXERIL) 10 MG tablet Take by mouth.   diphenhydrAMINE (BENADRYL) 25 MG tablet Take 25 mg by mouth every 6 (six) hours as needed.   DULoxetine  (CYMBALTA) 60 MG capsule Take 60 mg by mouth daily.   fenofibrate (TRICOR) 145 MG tablet Take by mouth.   gabapentin (NEURONTIN) 300 MG capsule Take 300 mg by mouth 3 (three) times daily.   lisinopril (PRINIVIL,ZESTRIL) 10 MG tablet Take 10 mg by mouth daily.   metFORMIN (GLUCOPHAGE) 500 MG tablet take 1 tablet by oral route every morning and 1/2 tablet every evening   Multiple Vitamin (MULTIVITAMIN) tablet Take 1 tablet by mouth daily.   omeprazole (PRILOSEC) 20 MG capsule Take by mouth.   traZODone (DESYREL) 100 MG tablet Take by mouth.    Allergies:   Penicillins   Social History   Socioeconomic History   Marital status: Widowed    Spouse name: Not on file   Number of children: Not on file   Years of education: Not on file   Highest education level: Not on file  Occupational History   Not on file  Tobacco Use   Smoking status: Every Day    Packs/day: 0.25    Types: Cigarettes   Smokeless tobacco: Never  Substance and Sexual Activity   Alcohol use: Yes    Comment: occas   Drug use: No   Sexual activity: Yes    Birth control/protection:  Surgical  Other Topics Concern   Not on file  Social History Narrative   Not on file   Social Determinants of Health   Financial Resource Strain: Not on file  Food Insecurity: Not on file  Transportation Needs: Not on file  Physical Activity: Not on file  Stress: Not on file  Social Connections: Not on file     Family History:  The patient's family history includes Cancer in her mother and another family member; Diabetes in her brother.   ROS:   Please see the history of present illness.    ROS All other systems reviewed and are negative.  No flowsheet data found.     PHYSICAL EXAM:   VS:  BP 118/78 (BP Location: Left Arm, Patient Position: Sitting, Cuff Size: Normal)   Pulse 91   Ht 5\' 5"  (1.651 m)   Wt 204 lb (92.5 kg)   SpO2 97%   BMI 33.95 kg/m    GEN: Well nourished, well developed, in no acute distress  HEENT:  normal  Neck: no JVD, carotid bruits, or masses Cardiac: RRR; no murmurs, rubs, or gallops,no edema.  Intact distal pulses bilaterally.  Respiratory:  clear to auscultation bilaterally, normal work of breathing GI: soft, nontender, nondistended, + BS MS: no deformity or atrophy  Skin: warm and dry, no rash Neuro:  Alert and Oriented x 3, Strength and sensation are intact Psych: euthymic mood, full affect  Wt Readings from Last 3 Encounters:  12/10/20 204 lb (92.5 kg)  11/27/20 204 lb (92.5 kg)  02/11/16 209 lb (94.8 kg)      Studies/Labs Reviewed:   EKG:  EKG is ordered today.  The ekg ordered today demonstrates normal sinus rhythm with no ST changes  Recent Labs: 11/27/2020: ALT 32; BUN 8; Creatinine, Ser 0.74; Hemoglobin 13.3; Platelets 283; Potassium 4.8; Sodium 136; TSH 1.760   Lipid Panel    Component Value Date/Time   CHOL 125 11/27/2020 1219   TRIG 211 (H) 11/27/2020 1219   HDL 37 (L) 11/27/2020 1219   CHOLHDL 3.4 11/27/2020 1219   LDLCALC 54 11/27/2020 1219   Additional studies/ records that were reviewed today include:  Office visit notes from PCP and EKG    ASSESSMENT:    1. Heart murmur      PLAN:  In order of problems listed above:  Heart murmur -She has no history of heart problems in the past -We will get a 2D echocardiogram to assess for valvular heart disease  2.  Chest pain/DOE -this is somewhat atypical in that it occurs with rest but also with emotional stress and sometimes exertion but is associated with nausea, diaphoresis and SOB which are new -her CRFs include HTN, Obesity and ongoing tobacco use -I will get a coronary CTA to define coronary anatomy and 2D echo to assess LVF and valves -her last LDL was 54 on 11/27/2020 -TSH normal and HbA1C was elevated at 7% and Hbg normal  Time Spent: 25 minutes total time of encounter, including 15 minutes spent in face-to-face patient care on the date of this encounter. This time includes  coordination of care and counseling regarding above mentioned problem list. Remainder of non-face-to-face time involved reviewing chart documents/testing relevant to the patient encounter and documentation in the medical record. I have independently reviewed documentation from referring provider  Medication Adjustments/Labs and Tests Ordered: Current medicines are reviewed at length with the patient today.  Concerns regarding medicines are outlined above.  Medication changes, Labs and Tests  ordered today are listed in the Patient Instructions below.  There are no Patient Instructions on file for this visit.   Signed, Armanda Magic, MD  12/10/2020 3:13 PM    [facility] [address] [place], [place], [place]  [address] Phone: [phone]; Fax: [phone]

## 2020-12-10 NOTE — Telephone Encounter (Signed)
REFERRAL REQUEST Telephone Note  Have you been seen at our office for this problem? yes (Advise that they may need an appointment with their PCP before a referral can be done)  Reason for Referral: needs pain management dr. The one she was referred to doesn't take [employer] Referral discussed with patient: yes Best contact number of patient for referral team: [phone] Has patient been seen by a specialist for this issue before:  Patient provider preference for referral:  Patient location preference for referral:    Patient notified that referrals can take up to a week or longer to process. If they haven't heard anything within a week they should call back and speak with the referral department.

## 2020-12-10 NOTE — Addendum Note (Signed)
Addended by: Theresia Majors on: 12/10/2020 03:22 PM   Modules accepted: Orders

## 2020-12-10 NOTE — Patient Instructions (Signed)
Medication Instructions:  Your physician recommends that you continue on your current medications as directed. Please refer to the Current Medication list given to you today.  *If you need a refill on your cardiac medications before your next appointment, please call your pharmacy*   Lab Work: BMET prior to CT scan If you have labs (blood work) drawn today and your tests are completely normal, you will receive your results only by: MyChart Message (if you have MyChart) OR A paper copy in the mail If you have any lab test that is abnormal or we need to change your treatment, we will call you to review the results.   Testing/Procedures: Your physician has requested that you have an echocardiogram. Echocardiography is a painless test that uses sound waves to create images of your heart. It provides your doctor with information about the size and shape of your heart and how well your heart's chambers and valves are working. This procedure takes approximately one hour. There are no restrictions for this procedure.  Your physician has requested that you have a coronary CTA scan. Please see next page for further instructions.    Follow-Up: At [facility], you and your health needs are our priority.  As part of our continuing mission to provide you with exceptional heart care, we have created designated Provider Care Teams.  These Care Teams include your primary Cardiologist (physician) and Advanced Practice Providers (APPs -  Physician Assistants and Nurse Practitioners) who all work together to provide you with the care you need, when you need it.  Follow up with Dr. Mayford Knife as needed based on results of testing.    Other Instructions   Your cardiac CT will be scheduled at:  [facility] [address] [place], [place] [address] [phone]  Please arrive at the [facility] main entrance (entrance A) of [facility] 30 minutes prior to test start time. You can  use the FREE valet parking offered at the main entrance (encouraged to control the heart rate for the test) Proceed to the [facility] Radiology Department (first floor) to check-in and test prep.  Please follow these instructions carefully (unless otherwise directed):  On the Night Before the Test: Be sure to Drink plenty of water. Do not consume any caffeinated/decaffeinated beverages or chocolate 12 hours prior to your test. Do not take any antihistamines 12 hours prior to your test.  On the Day of the Test: Drink plenty of water until 1 hour prior to the test. Do not eat any food 4 hours prior to the test. You may take your regular medications prior to the test.  Take metoprolol (Lopressor) two hours prior to test. HOLD Furosemide/Hydrochlorothiazide morning of the test. FEMALES- please wear underwire-free bra if available, avoid dresses & tight clothing      After the Test: Drink plenty of water. After receiving IV contrast, you may experience a mild flushed feeling. This is normal. On occasion, you may experience a mild rash up to 24 hours after the test. This is not dangerous. If this occurs, you can take Benadryl 25 mg and increase your fluid intake. If you experience trouble breathing, this can be serious. If it is severe call 911 IMMEDIATELY. If it is mild, please call our office. If you take any of these medications: Glipizide/Metformin, Avandament, Glucavance, please do not take 48 hours after completing test unless otherwise instructed.  Please allow 2-4 weeks for scheduling of routine cardiac CTs. Some insurance companies require a  pre-authorization which may delay scheduling of this test.   For non-scheduling related questions, please contact the cardiac imaging nurse navigator should you have any questions/concerns: Rockwell Alexandria, Cardiac Imaging Nurse Navigator Larey Brick, Cardiac Imaging Nurse Navigator [facility] Direct Office Dial:  [phone]   For scheduling needs, including cancellations and rescheduling, please call [place], [phone].

## 2020-12-18 ENCOUNTER — Telehealth (HOSPITAL_COMMUNITY): Payer: Self-pay | Admitting: *Deleted

## 2020-12-18 NOTE — Telephone Encounter (Signed)
Reaching out to patient to offer assistance regarding upcoming cardiac imaging study; pt verbalizes understanding of appt date/time, parking situation and where to check in, pre-test NPO status and medications ordered, and verified current allergies; name and call back number provided for further questions should they arise ° °Keyanna Sandefer RN Navigator Cardiac Imaging °[facility] °[phone] office °[phone] cell  ° °Patient to take 100mg metoprolol tartrate two hours prior to cardiac CT scan. °

## 2020-12-21 ENCOUNTER — Ambulatory Visit (HOSPITAL_COMMUNITY)
Admission: RE | Admit: 2020-12-21 | Discharge: 2020-12-21 | Disposition: A | Discharge status: Home / Self Care | Payer: 59 | Source: Ambulatory Visit | Attending: Cardiology | Admitting: Cardiology

## 2020-12-21 ENCOUNTER — Other Ambulatory Visit: Payer: Self-pay

## 2020-12-21 ENCOUNTER — Encounter: Payer: Self-pay | Admitting: Cardiology

## 2020-12-21 DIAGNOSIS — R011 Cardiac murmur, unspecified: Secondary | ICD-10-CM | POA: Insufficient documentation

## 2020-12-21 DIAGNOSIS — I2510 Atherosclerotic heart disease of native coronary artery without angina pectoris: Secondary | ICD-10-CM | POA: Insufficient documentation

## 2020-12-21 MED ORDER — NITROGLYCERIN 0.4 MG SL SUBL
0.8000 mg | SUBLINGUAL_TABLET | Freq: Once | SUBLINGUAL | Status: AC
  Administered 2020-12-21: 0.8 mg via SUBLINGUAL

## 2020-12-21 MED ORDER — IOHEXOL 350 MG/ML SOLN
95.0000 mL | Freq: Once | INTRAVENOUS | Status: AC | PRN
  Administered 2020-12-21: 95 mL via INTRAVENOUS

## 2020-12-21 MED ORDER — NITROGLYCERIN 0.4 MG SL SUBL
SUBLINGUAL_TABLET | SUBLINGUAL | Status: AC
  Filled 2020-12-21: qty 2

## 2020-12-24 ENCOUNTER — Telehealth: Payer: Self-pay

## 2020-12-24 DIAGNOSIS — K760 Fatty (change of) liver, not elsewhere classified: Secondary | ICD-10-CM

## 2020-12-24 NOTE — Telephone Encounter (Signed)
CT results discussed with patient. Referral placed to GI.

## 2020-12-24 NOTE — Telephone Encounter (Signed)
Renee Reichert, MD  12/21/2020  6:14 PM EDT Back to Top    Coronary CTA showed coronary Ca score of 7.1 and mild CAD of the RCA with soft plaque < 50% in the mid RCA.  Non cardiac portion showed mucosal thickening in the distal esophagus and fatty liver - please refer to Dr. Marca Ancona with [facility] for further evaluation. Continue ASA and statin for now - LDL at goal

## 2020-12-28 ENCOUNTER — Other Ambulatory Visit: Payer: Self-pay | Admitting: Family Medicine

## 2020-12-28 ENCOUNTER — Encounter: Payer: Self-pay | Admitting: Internal Medicine

## 2020-12-28 ENCOUNTER — Encounter: Payer: Self-pay | Admitting: Physical Medicine and Rehabilitation

## 2020-12-28 NOTE — Telephone Encounter (Signed)
  Prescription Request  12/28/2020  Is this a "Controlled Substance" medicine? NO  Have you seen your PCP in the last 2 weeks? NO  If YES, route message to pool  -  If NO, patient needs to be scheduled for appointment.  What is the name of the medication or equipment? Needs all her RX called in  Have you contacted your pharmacy to request a refill? NO   Which pharmacy would you like this sent to? [facility]   Patient notified that their request is being sent to the clinical staff for review and that they should receive a response within 2 business days.

## 2020-12-29 MED ORDER — TRAZODONE HCL 100 MG PO TABS: 100.0000 mg | ORAL_TABLET | Freq: Every evening | ORAL | 0 refills | Status: AC | PRN

## 2020-12-29 MED ORDER — FENOFIBRATE 145 MG PO TABS: 145.0000 mg | ORAL_TABLET | Freq: Every day | ORAL | 2 refills | Status: DC

## 2020-12-29 MED ORDER — CITALOPRAM HYDROBROMIDE 20 MG PO TABS: 20.0000 mg | ORAL_TABLET | Freq: Every day | ORAL | 2 refills | Status: AC

## 2020-12-29 MED ORDER — OMEPRAZOLE 20 MG PO CPDR: 20.0000 mg | DELAYED_RELEASE_CAPSULE | Freq: Every day | ORAL | 2 refills | Status: DC

## 2020-12-29 MED ORDER — DULOXETINE HCL 60 MG PO CPEP: 60.0000 mg | ORAL_CAPSULE | Freq: Every day | ORAL | 2 refills | Status: DC

## 2020-12-29 MED ORDER — LISINOPRIL 10 MG PO TABS: 10.0000 mg | ORAL_TABLET | Freq: Every day | ORAL | 2 refills | Status: DC

## 2020-12-29 MED ORDER — METFORMIN HCL 500 MG PO TABS: ORAL_TABLET | ORAL | 2 refills | Status: DC

## 2020-12-29 MED ORDER — METOPROLOL TARTRATE 100 MG PO TABS: 100.0000 mg | ORAL_TABLET | Freq: Once | ORAL | 0 refills | Status: DC

## 2020-12-29 MED ORDER — ATORVASTATIN CALCIUM 40 MG PO TABS: 40.0000 mg | ORAL_TABLET | Freq: Every day | ORAL | 2 refills | Status: DC

## 2021-01-12 ENCOUNTER — Encounter: Payer: Self-pay | Admitting: Family Medicine

## 2021-01-12 ENCOUNTER — Ambulatory Visit (INDEPENDENT_AMBULATORY_CARE_PROVIDER_SITE_OTHER): Payer: 59 | Admitting: Family Medicine

## 2021-01-12 DIAGNOSIS — J069 Acute upper respiratory infection, unspecified: Secondary | ICD-10-CM

## 2021-01-12 DIAGNOSIS — J0140 Acute pansinusitis, unspecified: Secondary | ICD-10-CM | POA: Diagnosis not present

## 2021-01-12 MED ORDER — BENZONATATE 200 MG PO CAPS
200.0000 mg | ORAL_CAPSULE | Freq: Two times a day (BID) | ORAL | 0 refills | Status: DC | PRN
Start: 2021-01-12 — End: 2021-01-22

## 2021-01-12 MED ORDER — FLUTICASONE PROPIONATE 50 MCG/ACT NA SUSP: 2.0000 | Freq: Every day | NASAL | 6 refills | Status: DC

## 2021-01-12 MED ORDER — DOXYCYCLINE HYCLATE 100 MG PO TABS
100.0000 mg | ORAL_TABLET | Freq: Two times a day (BID) | ORAL | 0 refills | Status: AC
Start: 2021-01-12 — End: 2021-01-22

## 2021-01-12 NOTE — Progress Notes (Signed)
Virtual Visit via telephone Note Due to COVID-19 pandemic this visit was conducted virtually. This visit type was conducted due to national recommendations for restrictions regarding the COVID-19 Pandemic (e.g. social distancing, sheltering in place) in an effort to limit this patient's exposure and mitigate transmission in our community. All issues noted in this document were discussed and addressed.  A physical exam was not performed with this format.   I connected with Renee Reilly on 01/12/2021 at 1132 by telephone and verified that I am speaking with the correct person using two identifiers. Ciclaly Mulcahey is currently located at home and patient is currently with them during visit. The provider, Kari Baars, FNP is located in their office at time of visit.  I discussed the limitations, risks, security and privacy concerns of performing an evaluation and management service by telephone and the availability of in person appointments. I also discussed with the patient that there may be a patient responsible charge related to this service. The patient expressed understanding and agreed to proceed.  Subjective:  Patient ID: Renee Reilly, female    DOB: 02-22-1965, 55 y.o.   MRN: [id number]  Chief Complaint:  URI   HPI: Renee Reilly is a 55 y.o. female presenting on 01/12/2021 for URI   Pt reports cough, congestion, sinus pressure, and headache for over 2 weeks. She has been using OTC cough suppressant without relief of symptoms.   URI  This is a new problem. The current episode started 1 to 4 weeks ago. The problem has been gradually worsening. Associated symptoms include congestion, coughing, headaches, a plugged ear sensation, rhinorrhea and sinus pain. Pertinent negatives include no abdominal pain, chest pain, diarrhea, dysuria, ear pain, joint pain, joint swelling, nausea, neck pain, rash, sneezing, sore throat, swollen glands, vomiting or wheezing. She has tried decongestant for the  symptoms. The treatment provided no relief.    Relevant past medical, surgical, family, and social history reviewed and updated as indicated.  Allergies and medications reviewed and updated.   Past Medical History:  Diagnosis Date   Anxiety    CAD (coronary artery disease), native coronary artery    coronary Ca score of 7.1 and mild CAD  of the RCA with soft plaque < 50% in the mid RCA   Carpal tunnel syndrome    Depression    Hypertension     Past Surgical History:  Procedure Laterality Date   TUBAL LIGATION      Social History   Socioeconomic History   Marital status: Widowed    Spouse name: Not on file   Number of children: Not on file   Years of education: Not on file   Highest education level: Not on file  Occupational History   Not on file  Tobacco Use   Smoking status: Every Day    Packs/day: 0.25    Types: Cigarettes   Smokeless tobacco: Never  Substance and Sexual Activity   Alcohol use: Yes    Comment: occas   Drug use: No   Sexual activity: Yes    Birth control/protection: Surgical  Other Topics Concern   Not on file  Social History Narrative   Not on file   Social Determinants of Health   Financial Resource Strain: Not on file  Food Insecurity: Not on file  Transportation Needs: Not on file  Physical Activity: Not on file  Stress: Not on file  Social Connections: Not on file  Intimate Partner Violence: Not on file    Outpatient Encounter  Medications as of 01/12/2021  Medication Sig   benzonatate (TESSALON) 200 MG capsule Take 1 capsule (200 mg total) by mouth 2 (two) times daily as needed for cough.   doxycycline (VIBRA-TABS) 100 MG tablet Take 1 tablet (100 mg total) by mouth 2 (two) times daily for 10 days. 1 po bid   fluticasone (FLONASE) 50 MCG/ACT nasal spray Place 2 sprays into both nostrils daily.   aspirin 81 MG chewable tablet Chew 81 mg by mouth daily.   atorvastatin (LIPITOR) 40 MG tablet Take 1 tablet (40 mg total) by mouth  daily.   Cholecalciferol (VITAMIN D3) 125 MCG (5000 UT) TABS Take by mouth.   citalopram (CELEXA) 20 MG tablet Take 1 tablet (20 mg total) by mouth daily.   cyclobenzaprine (FLEXERIL) 10 MG tablet Take by mouth.   diphenhydrAMINE (BENADRYL) 25 MG tablet Take 25 mg by mouth every 6 (six) hours as needed.   DULoxetine (CYMBALTA) 60 MG capsule Take 1 capsule (60 mg total) by mouth daily.   fenofibrate (TRICOR) 145 MG tablet Take 1 tablet (145 mg total) by mouth daily.   gabapentin (NEURONTIN) 300 MG capsule Take 300 mg by mouth 3 (three) times daily.   lisinopril (ZESTRIL) 10 MG tablet Take 1 tablet (10 mg total) by mouth daily.   metFORMIN (GLUCOPHAGE) 500 MG tablet take 1 tablet by oral route every morning and 1/2 tablet every evening   metoprolol tartrate (LOPRESSOR) 100 MG tablet Take 1 tablet (100 mg total) by mouth once for 1 dose. Take 1 tablet two hours prior to CT scan.   Multiple Vitamin (MULTIVITAMIN) tablet Take 1 tablet by mouth daily.   omeprazole (PRILOSEC) 20 MG capsule Take 1 capsule (20 mg total) by mouth daily.   traZODone (DESYREL) 100 MG tablet Take 1 tablet (100 mg total) by mouth at bedtime as needed for sleep.   No facility-administered encounter medications on file as of 01/12/2021.    Allergies  Allergen Reactions   Penicillins Anaphylaxis    Review of Systems  Constitutional:  Positive for activity change. Negative for appetite change, chills, diaphoresis, fatigue, fever and unexpected weight change.  HENT:  Positive for congestion, postnasal drip, rhinorrhea, sinus pressure and sinus pain. Negative for drooling, ear discharge, ear pain, facial swelling, hearing loss, mouth sores, nosebleeds, sneezing, sore throat, trouble swallowing and voice change.   Eyes: Negative.   Respiratory:  Positive for cough. Negative for chest tightness, shortness of breath and wheezing.   Cardiovascular:  Negative for chest pain, palpitations and leg swelling.  Gastrointestinal:   Negative for abdominal pain, blood in stool, constipation, diarrhea, nausea and vomiting.  Endocrine: Negative.   Genitourinary:  Negative for decreased urine volume, difficulty urinating, dysuria, frequency and urgency.  Musculoskeletal:  Negative for arthralgias, joint pain, myalgias and neck pain.  Skin: Negative.  Negative for rash.  Allergic/Immunologic: Negative.   Neurological:  Positive for headaches. Negative for dizziness, tremors, seizures, syncope, facial asymmetry, speech difficulty, weakness, light-headedness and numbness.  Hematological: Negative.   Psychiatric/Behavioral:  Negative for confusion, hallucinations, sleep disturbance and suicidal ideas.   All other systems reviewed and are negative.       Observations/Objective: No vital signs or physical exam, this was a telephone or virtual health encounter.  Pt alert and oriented, answers all questions appropriately, and able to speak in full sentences.    Assessment and Plan: Sabryna was seen today for uri.  Diagnoses and all orders for this visit:  URI with cough and congestion Acute  non-recurrent pansinusitis Ongoing symptoms for over 14 days. Will initiate doxycycline, Flonase, and tessalon as prescribed. Symptomatic care discussed in detail. Pt aware to report any new, worsening, or persistent symptoms. Follow up as needed.  -     fluticasone (FLONASE) 50 MCG/ACT nasal spray; Place 2 sprays into both nostrils daily. -     benzonatate (TESSALON) 200 MG capsule; Take 1 capsule (200 mg total) by mouth 2 (two) times daily as needed for cough. -     doxycycline (VIBRA-TABS) 100 MG tablet; Take 1 tablet (100 mg total) by mouth 2 (two) times daily for 10 days. 1 po bid    Follow Up Instructions: Return if symptoms worsen or fail to improve.    I discussed the assessment and treatment plan with the patient. The patient was provided an opportunity to ask questions and all were answered. The patient agreed with the plan  and demonstrated an understanding of the instructions.   The patient was advised to call back or seek an in-person evaluation if the symptoms worsen or if the condition fails to improve as anticipated.  The above assessment and management plan was discussed with the patient. The patient verbalized understanding of and has agreed to the management plan. Patient is aware to call the clinic if they develop any new symptoms or if symptoms persist or worsen. Patient is aware when to return to the clinic for a follow-up visit. Patient educated on when it is appropriate to go to the emergency department.    I provided 11 minutes of non-face-to-face time during this encounter. The call started at 1132. The call ended at 1142. The other time was used for coordination of care.    Kari Baars, FNP-C [facility] [address] [place], [place] [address] [phone] 01/12/2021

## 2021-01-13 ENCOUNTER — Telehealth: Payer: 59 | Admitting: Family Medicine

## 2021-01-20 ENCOUNTER — Telehealth: Payer: Self-pay | Admitting: Family Medicine

## 2021-01-20 NOTE — Telephone Encounter (Signed)
Pt called stating that Renee Reilly called in all of her medicines except her muscle relaxer Rx and needs that called in as well.   Pt uses [facility] in [place].   Pt also says that she needs some other medicine called in for her cough because what shes taking now is not working.

## 2021-01-21 NOTE — Telephone Encounter (Signed)
Attempted to contact patient - NA °

## 2021-01-22 ENCOUNTER — Other Ambulatory Visit: Payer: Self-pay

## 2021-01-22 ENCOUNTER — Ambulatory Visit (HOSPITAL_COMMUNITY)
Admission: RE | Admit: 2021-01-22 | Discharge: 2021-01-22 | Disposition: A | Discharge status: Home / Self Care | Payer: 59 | Source: Ambulatory Visit | Attending: Cardiology | Admitting: Cardiology

## 2021-01-22 ENCOUNTER — Other Ambulatory Visit: Payer: Self-pay | Admitting: *Deleted

## 2021-01-22 DIAGNOSIS — R011 Cardiac murmur, unspecified: Secondary | ICD-10-CM

## 2021-01-22 DIAGNOSIS — J069 Acute upper respiratory infection, unspecified: Secondary | ICD-10-CM

## 2021-01-22 DIAGNOSIS — J0140 Acute pansinusitis, unspecified: Secondary | ICD-10-CM

## 2021-01-22 LAB — ECHOCARDIOGRAM COMPLETE
Area-P 1/2: 2.76 cm2
S' Lateral: 3.2 cm

## 2021-01-22 MED ORDER — BENZONATATE 200 MG PO CAPS: 200.0000 mg | ORAL_CAPSULE | Freq: Two times a day (BID) | ORAL | 0 refills | Status: DC | PRN

## 2021-01-22 NOTE — Telephone Encounter (Signed)
Spoke to patient. She asked for another script of benzonatate. Spoke with Kari Baars, FNP. She said that would be okay. Rx sent. Patient notified

## 2021-01-22 NOTE — Progress Notes (Signed)
*  PRELIMINARY RESULTS* Echocardiogram 2D Echocardiogram has been performed.  Renee Reilly 01/22/2021, 12:42 PM

## 2021-01-25 ENCOUNTER — Telehealth: Payer: Self-pay

## 2021-01-25 NOTE — Telephone Encounter (Signed)
Patient notified and verbalized understanding of Echo results. Pt had no questions or concerns at this time.

## 2021-01-25 NOTE — Telephone Encounter (Signed)
-----   Message from Quintella Reichert, MD sent at 01/22/2021  2:44 PM EST ----- 2D echo showed normal LV function with EF 55 to 60% and trivial leakiness of the mitral valve.  Essentially normal echo

## 2021-02-10 ENCOUNTER — Ambulatory Visit: Payer: 59 | Admitting: Internal Medicine

## 2021-02-10 ENCOUNTER — Encounter: Payer: Self-pay | Admitting: Internal Medicine

## 2021-03-02 ENCOUNTER — Encounter: Payer: Self-pay | Admitting: Family Medicine

## 2021-03-02 ENCOUNTER — Ambulatory Visit (INDEPENDENT_AMBULATORY_CARE_PROVIDER_SITE_OTHER): Payer: 59 | Admitting: Family Medicine

## 2021-03-02 ENCOUNTER — Telehealth: Payer: Self-pay | Admitting: Family Medicine

## 2021-03-02 VITALS — BP 101/65 | HR 74 | Temp 98.8°F | Ht 65.0 in | Wt 207.0 lb

## 2021-03-02 DIAGNOSIS — M62838 Other muscle spasm: Secondary | ICD-10-CM | POA: Diagnosis not present

## 2021-03-02 DIAGNOSIS — E1169 Type 2 diabetes mellitus with other specified complication: Secondary | ICD-10-CM | POA: Diagnosis not present

## 2021-03-02 LAB — BAYER DCA HB A1C WAIVED: HB A1C (BAYER DCA - WAIVED): 7.3 % — ABNORMAL HIGH (ref 4.8–5.6)

## 2021-03-02 MED ORDER — METFORMIN HCL 1000 MG PO TABS: 1000.0000 mg | ORAL_TABLET | Freq: Two times a day (BID) | ORAL | 5 refills | Status: DC

## 2021-03-02 MED ORDER — CYCLOBENZAPRINE HCL 10 MG PO TABS: 10.0000 mg | ORAL_TABLET | Freq: Three times a day (TID) | ORAL | 0 refills | Status: DC | PRN

## 2021-03-02 NOTE — Patient Instructions (Signed)

## 2021-03-02 NOTE — Telephone Encounter (Signed)
Needs appt

## 2021-03-02 NOTE — Progress Notes (Signed)
Subjective:  Patient ID: Renee Reilly, female    DOB: 07-27-1965, 56 y.o.   MRN: [id number]  Patient Care Team: Baruch Gouty, FNP as PCP - General (Family Medicine)   Chief Complaint:  Diabetes (3 month follow up)   HPI: Renee Reilly is a 56 y.o. female presenting on 03/02/2021 for Diabetes (3 month follow up)   Diabetes She presents for her follow-up diabetic visit. She has type 2 diabetes mellitus. Pertinent negatives for hypoglycemia include no confusion, dizziness, headaches, hunger, mood changes, nervousness/anxiousness, pallor, seizures, sleepiness, speech difficulty, sweats or tremors. Associated symptoms include foot paresthesias. Pertinent negatives for diabetes include no blurred vision, no chest pain, no fatigue, no foot ulcerations, no polydipsia, no polyphagia, no polyuria, no visual change, no weakness and no weight loss. There are no hypoglycemic complications. Symptoms are stable. Risk factors for coronary artery disease include dyslipidemia, diabetes mellitus, obesity and sedentary lifestyle. Current diabetic treatment includes oral agent (monotherapy). Meal planning includes avoidance of concentrated sweets. She rarely participates in exercise. An ACE inhibitor/angiotensin II receptor blocker is being taken. Eye exam is not current.  Pt continues to complain of bilateral leg cramps and spasms. She has an appointment scheduled with pain management next month but would like something to help relieve the spasms and cramps until appointment.     Relevant past medical, surgical, family, and social history reviewed and updated as indicated.  Allergies and medications reviewed and updated. Data reviewed: Chart in Epic.   Past Medical History:  Diagnosis Date   Anxiety    CAD (coronary artery disease), native coronary artery    coronary Ca score of 7.1 and mild CAD  of the RCA with soft plaque < 50% in the mid RCA   Carpal tunnel syndrome    Depression    Hypertension      Past Surgical History:  Procedure Laterality Date   TUBAL LIGATION      Social History   Socioeconomic History   Marital status: Widowed    Spouse name: Not on file   Number of children: Not on file   Years of education: Not on file   Highest education level: Not on file  Occupational History   Not on file  Tobacco Use   Smoking status: Every Day    Packs/day: 0.25    Types: Cigarettes   Smokeless tobacco: Never  Substance and Sexual Activity   Alcohol use: Yes    Comment: occas   Drug use: No   Sexual activity: Yes    Birth control/protection: Surgical  Other Topics Concern   Not on file  Social History Narrative   Not on file   Social Determinants of Health   Financial Resource Strain: Not on file  Food Insecurity: Not on file  Transportation Needs: Not on file  Physical Activity: Not on file  Stress: Not on file  Social Connections: Not on file  Intimate Partner Violence: Not on file    Outpatient Encounter Medications as of 03/02/2021  Medication Sig   metFORMIN (GLUCOPHAGE) 1000 MG tablet Take 1 tablet (1,000 mg total) by mouth 2 (two) times daily with a meal.   aspirin 81 MG chewable tablet Chew 81 mg by mouth daily.   atorvastatin (LIPITOR) 40 MG tablet Take 1 tablet (40 mg total) by mouth daily.   benzonatate (TESSALON) 200 MG capsule Take 1 capsule (200 mg total) by mouth 2 (two) times daily as needed for cough.   Cholecalciferol (VITAMIN D3) 125  MCG (5000 UT) TABS Take by mouth.   citalopram (CELEXA) 20 MG tablet Take 1 tablet (20 mg total) by mouth daily.   cyclobenzaprine (FLEXERIL) 10 MG tablet Take 1 tablet (10 mg total) by mouth 3 (three) times daily as needed for muscle spasms.   diphenhydrAMINE (BENADRYL) 25 MG tablet Take 25 mg by mouth every 6 (six) hours as needed.   DULoxetine (CYMBALTA) 60 MG capsule Take 1 capsule (60 mg total) by mouth daily.   fenofibrate (TRICOR) 145 MG tablet Take 1 tablet (145 mg total) by mouth daily.    fluticasone (FLONASE) 50 MCG/ACT nasal spray Place 2 sprays into both nostrils daily.   gabapentin (NEURONTIN) 300 MG capsule Take 300 mg by mouth 3 (three) times daily.   lisinopril (ZESTRIL) 10 MG tablet Take 1 tablet (10 mg total) by mouth daily.   metoprolol tartrate (LOPRESSOR) 100 MG tablet Take 1 tablet (100 mg total) by mouth once for 1 dose. Take 1 tablet two hours prior to CT scan.   Multiple Vitamin (MULTIVITAMIN) tablet Take 1 tablet by mouth daily.   omeprazole (PRILOSEC) 20 MG capsule Take 1 capsule (20 mg total) by mouth daily.   traZODone (DESYREL) 100 MG tablet Take 1 tablet (100 mg total) by mouth at bedtime as needed for sleep.   [DISCONTINUED] cyclobenzaprine (FLEXERIL) 10 MG tablet Take by mouth.   [DISCONTINUED] metFORMIN (GLUCOPHAGE) 500 MG tablet take 1 tablet by oral route every morning and 1/2 tablet every evening   No facility-administered encounter medications on file as of 03/02/2021.    Allergies  Allergen Reactions   Penicillins Anaphylaxis    Review of Systems  Constitutional:  Negative for activity change, appetite change, chills, diaphoresis, fatigue, fever, unexpected weight change and weight loss.  HENT: Negative.    Eyes: Negative.  Negative for blurred vision, photophobia and visual disturbance.  Respiratory:  Negative for cough, chest tightness and shortness of breath.   Cardiovascular:  Negative for chest pain, palpitations and leg swelling.  Gastrointestinal:  Negative for abdominal pain, blood in stool, constipation, diarrhea, nausea and vomiting.  Endocrine: Negative.  Negative for polydipsia, polyphagia and polyuria.  Genitourinary:  Negative for decreased urine volume, difficulty urinating, dysuria, frequency and urgency.  Musculoskeletal:  Positive for arthralgias, back pain and myalgias. Negative for gait problem, joint swelling and neck stiffness.  Skin: Negative.  Negative for pallor.  Allergic/Immunologic: Negative.   Neurological:   Negative for dizziness, tremors, seizures, syncope, facial asymmetry, speech difficulty, weakness, light-headedness, numbness and headaches.  Hematological: Negative.   Psychiatric/Behavioral:  Negative for confusion, hallucinations, sleep disturbance and suicidal ideas. The patient is not nervous/anxious.   All other systems reviewed and are negative.      Objective:  BP 101/65    Pulse 74    Temp 98.8 F (37.1 C)    Ht 5\' 5"  (1.651 m)    Wt 207 lb (93.9 kg)    LMP  (LMP Unknown)    SpO2 99%    BMI 34.45 kg/m    Wt Readings from Last 3 Encounters:  03/02/21 207 lb (93.9 kg)  12/10/20 204 lb (92.5 kg)  11/27/20 204 lb (92.5 kg)    Physical Exam Vitals and nursing note reviewed.  Constitutional:      General: She is not in acute distress.    Appearance: Normal appearance. She is well-developed and well-groomed. She is obese. She is not ill-appearing, toxic-appearing or diaphoretic.  HENT:     Head: Normocephalic and atraumatic.  Jaw: There is normal jaw occlusion.     Right Ear: Hearing normal.     Left Ear: Hearing normal.     Nose: Nose normal.     Mouth/Throat:     Lips: Pink.     Mouth: Mucous membranes are moist.     Pharynx: Oropharynx is clear. Uvula midline.  Eyes:     General: Lids are normal.     Extraocular Movements: Extraocular movements intact.     Conjunctiva/sclera: Conjunctivae normal.     Pupils: Pupils are equal, round, and reactive to light.  Neck:     Thyroid: No thyroid mass, thyromegaly or thyroid tenderness.     Vascular: No carotid bruit or JVD.     Trachea: Trachea and phonation normal.  Cardiovascular:     Rate and Rhythm: Normal rate and regular rhythm.     Chest Wall: PMI is not displaced.     Pulses: Normal pulses.     Heart sounds: Murmur heard.  Systolic murmur is present with a grade of 2/6.    No friction rub. No gallop.  Pulmonary:     Effort: Pulmonary effort is normal. No respiratory distress.     Breath sounds: Normal breath  sounds. No wheezing.  Abdominal:     General: Bowel sounds are normal. There is no distension or abdominal bruit.     Palpations: Abdomen is soft. There is no hepatomegaly or splenomegaly.     Tenderness: There is no abdominal tenderness. There is no right CVA tenderness or left CVA tenderness.     Hernia: No hernia is present.  Musculoskeletal:        General: Normal range of motion.     Cervical back: Normal range of motion and neck supple.     Right lower leg: No edema.     Left lower leg: No edema.  Lymphadenopathy:     Cervical: No cervical adenopathy.  Skin:    General: Skin is warm and dry.     Capillary Refill: Capillary refill takes less than 2 seconds.     Coloration: Skin is not cyanotic, jaundiced or pale.     Findings: No rash.  Neurological:     General: No focal deficit present.     Mental Status: She is alert and oriented to person, place, and time.     Sensory: Sensation is intact.     Motor: Motor function is intact.     Coordination: Coordination is intact.     Gait: Gait is intact.     Deep Tendon Reflexes: Reflexes are normal and symmetric.  Psychiatric:        Attention and Perception: Attention and perception normal.        Mood and Affect: Mood and affect normal.        Speech: Speech normal.        Behavior: Behavior normal. Behavior is cooperative.        Thought Content: Thought content normal.        Cognition and Memory: Cognition and memory normal.        Judgment: Judgment normal.    Results for orders placed or performed during the hospital encounter of 01/22/21  ECHOCARDIOGRAM COMPLETE  Result Value Ref Range   Area-P 1/2 2.76 cm2   S' Lateral 3.20 cm       Pertinent labs & imaging results that were available during my care of the patient were reviewed by me and considered in my medical decision making.  Assessment & Plan:  Lashonte was seen today for diabetes.  Diagnoses and all orders for this visit:  Type 2 diabetes mellitus with  other specified complication, without long-term current use of insulin (HCC) A1C 7.3 today. Has been taking 1000 mg metformin in the am and 500 in the pm. Will increase to 1000 mg twice daily. Diet and exercise encouraged. Will schedule diabetic retina scan in office. Follow up in 3 months for reevaluation.  -     Bayer DCA Hb A1c Waived -     metFORMIN (GLUCOPHAGE) 1000 MG tablet; Take 1 tablet (1,000 mg total) by mouth 2 (two) times daily with a meal.  Muscle spasm of both lower legs Has appointment next month with pain management. Will give short prescription of below for symptomatic relief.  -     cyclobenzaprine (FLEXERIL) 10 MG tablet; Take 1 tablet (10 mg total) by mouth 3 (three) times daily as needed for muscle spasms.     Continue all other maintenance medications.  Follow up plan: Return in about 3 months (around 05/31/2021), or if symptoms worsen or fail to improve, for DM, HTN, Lipids.   Continue healthy lifestyle choices, including diet (rich in fruits, vegetables, and lean proteins, and low in salt and simple carbohydrates) and exercise (at least 30 minutes of moderate physical activity daily).  Educational handout given for DM  The above assessment and management plan was discussed with the patient. The patient verbalized understanding of and has agreed to the management plan. Patient is aware to call the clinic if they develop any new symptoms or if symptoms persist or worsen. Patient is aware when to return to the clinic for a follow-up visit. Patient educated on when it is appropriate to go to the emergency department.   Monia Pouch, FNP-C [facility] [phone]

## 2021-03-03 ENCOUNTER — Inpatient Hospital Stay: Admission: RE | Admit: 2021-03-03 | Payer: Self-pay | Source: Ambulatory Visit

## 2021-03-09 ENCOUNTER — Encounter: Payer: 59 | Admitting: Family Medicine

## 2021-03-15 ENCOUNTER — Telehealth: Payer: Self-pay | Admitting: Family Medicine

## 2021-03-15 NOTE — Telephone Encounter (Signed)
°  Prescription Request  03/15/2021  What is the name of the medication or equipment? GABAPENTIN and IBUPROFEN 800MG   Have you contacted your pharmacy to request a refill? YES  Which pharmacy would you like this sent to? [facility] [place]

## 2021-03-16 NOTE — Telephone Encounter (Signed)
Pt aware and has appt scheduled Thursday with Rakes.

## 2021-03-18 ENCOUNTER — Telehealth: Payer: Self-pay | Admitting: Family Medicine

## 2021-03-18 ENCOUNTER — Encounter: Payer: 59 | Admitting: Family Medicine

## 2021-03-18 NOTE — Telephone Encounter (Signed)
°  Prescription Request  03/18/2021  Is this a "Controlled Substance" medicine? no  Have you seen your PCP in the last 2 weeks? 03/02/2021 next apt 04/09/2021  If YES, route message to pool  -  If NO, patient needs to be scheduled for appointment.  What is the name of the medication or equipment? gabapentin (NEURONTIN) 300 MG capsule Ibuprofen 800MG   Have you contacted your pharmacy to request a refill? no   Which pharmacy would you like this sent to? [facility]    Patient notified that their request is being sent to the clinical staff for review and that they should receive a response within 2 business days.

## 2021-03-22 NOTE — Telephone Encounter (Signed)
Her appointment is 03/26/21.

## 2021-03-26 ENCOUNTER — Ambulatory Visit
Admission: RE | Admit: 2021-03-26 | Discharge: 2021-03-26 | Disposition: A | Discharge status: Home / Self Care | Payer: 59 | Source: Ambulatory Visit | Attending: Physical Medicine and Rehabilitation | Admitting: Physical Medicine and Rehabilitation

## 2021-03-26 ENCOUNTER — Encounter: Payer: 59 | Attending: Physical Medicine and Rehabilitation | Admitting: Physical Medicine and Rehabilitation

## 2021-03-26 ENCOUNTER — Encounter: Payer: Self-pay | Admitting: Physical Medicine and Rehabilitation

## 2021-03-26 ENCOUNTER — Other Ambulatory Visit: Payer: Self-pay

## 2021-03-26 VITALS — BP 118/75 | HR 96 | Ht 65.0 in | Wt 202.0 lb

## 2021-03-26 DIAGNOSIS — M62838 Other muscle spasm: Secondary | ICD-10-CM | POA: Diagnosis not present

## 2021-03-26 DIAGNOSIS — G8929 Other chronic pain: Secondary | ICD-10-CM | POA: Diagnosis present

## 2021-03-26 DIAGNOSIS — M5442 Lumbago with sciatica, left side: Secondary | ICD-10-CM

## 2021-03-26 MED ORDER — CYCLOBENZAPRINE HCL 10 MG PO TABS: 10.0000 mg | ORAL_TABLET | Freq: Three times a day (TID) | ORAL | 5 refills | Status: DC | PRN

## 2021-03-26 MED ORDER — GABAPENTIN 300 MG PO CAPS: 300.0000 mg | ORAL_CAPSULE | Freq: Two times a day (BID) | ORAL | 5 refills | Status: DC

## 2021-03-26 NOTE — Progress Notes (Signed)
Subjective:    Patient ID: Renee Reilly, female    DOB: 13-Apr-1965, 56 y.o.   MRN: [id number]  HPI Pt is a 56 yr old female with hx of DM2 with A1c of 7.3 since 2019 and depression, and B/L carpal tunnel syndrome as well as diabetic neuropathy in feet-  And chronic back pain here for evaluation of pain issues.   Now has insurance, and was told needed for B/L carpal tunnel syndrome in 2011- but didn't have insurance.     Feet "kill her"- they hurt more than any other pain.  Aching, burning, tingling- numbness- on fire.   Feet/DM Neuropathy- numb, tingling and freezing/cold- to B/L ankles-  Chronic back pain- if trying to get up to do tasks- really will start to do something- does for 10 minutes, then has ot sit down- throbbing Worse if sits for prolonged period- a dull pain, but sometimes like a knife.   Was told might have spurs in back- but hasn't had imaging for more than 8 years- if not longer.  Pain radiates down back to legs and down to mid calf On LLE- not really as much on RLE.   Has been on pain meds before- years ago- 2010- took Percocet 5/325 mg- used to go to Dr Charm Barges- [facility] [place], [employer]- retired.   Used to go to [facility]- got gabapentin and Flexeril from them Now Ms Rakes- NP- she took pt off Gabapentin but wouldn't explain why. Said she wouldn't prescribe flexeril anymore.     Tried: Duloxetine- started in November- 60 mg daily- knocks the edge off.  Gabapentin- 300 2-3x/day- knocked the edge off of pain.  Flexeril prn- does help muscle spasms- doesn't make sleepy.  Has tried lidocaine patches- on feet- and back- no help.   Social Hx: not working right now applied for disability- has attorney- turned down 2x so far- in appeal- waiting to go in front of hearing.  Smokes 1/2 ppd- but not lately- trying to cut back- to 7-10 cigarettes/day.  No marijuana, no EtOH; no Hx of addiction.    Pain Inventory Average Pain 8 Pain Right Now 8 My pain is constant, sharp,  burning, stabbing, tingling, and aching  In the last 24 hours, has pain interfered with the following? General activity 7 Relation with others 7 Enjoyment of life 10 What TIME of day is your pain at its worst? morning , daytime, and night Sleep (in general) Fair  Pain is worse with: walking, bending, sitting, standing, and some activites Pain improves with: rest and medication Relief from Meds:  Fair with ibuprofen  walk without assistance ability to climb steps?  yes do you drive?  no  disabled: date disabled applied I need assistance with the following:  meal prep, household duties, and shopping Do you have any goals in this area?  yes  weakness numbness tingling trouble walking spasms dizziness confusion depression anxiety  Any changes since last visit?  no  Any changes since last visit?  no, New Patient    Family History  Problem Relation Age of Onset   Cancer Mother        Liver cancer   Diabetes Brother    Cancer Other    Social History   Socioeconomic History   Marital status: Widowed    Spouse name: Not on file   Number of children: Not on file   Years of education: Not on file   Highest education level: Not on file  Occupational History  Not on file  Tobacco Use   Smoking status: Every Day    Packs/day: 0.25    Types: Cigarettes   Smokeless tobacco: Never  Substance and Sexual Activity   Alcohol use: Yes    Comment: occas   Drug use: No   Sexual activity: Yes    Birth control/protection: Surgical  Other Topics Concern   Not on file  Social History Narrative   Not on file   Social Determinants of Health   Financial Resource Strain: Not on file  Food Insecurity: Not on file  Transportation Needs: Not on file  Physical Activity: Not on file  Stress: Not on file  Social Connections: Not on file   Past Surgical History:  Procedure Laterality Date   TUBAL LIGATION     Past Medical History:  Diagnosis Date   Anxiety    CAD  (coronary artery disease), native coronary artery    coronary Ca score of 7.1 and mild CAD  of the RCA with soft plaque < 50% in the mid RCA   Carpal tunnel syndrome    Depression    Hypertension    LMP  (LMP Unknown)   Opioid Risk Score:   Fall Risk Score:  `1  Depression screen PHQ 2/9  Depression screen [facility] 2/9 03/02/2021 11/27/2020  Decreased Interest 1 2  Down, Depressed, Hopeless 1 3  PHQ - 2 Score 2 5  Altered sleeping 2 3  Tired, decreased energy 2 3  Change in appetite 1 2  Feeling bad or failure about yourself  1 2  Trouble concentrating 2 2  Moving slowly or fidgety/restless 1 3  Suicidal thoughts 0 0  PHQ-9 Score 11 20  Difficult doing work/chores Very difficult Very difficult    Review of Systems  Musculoskeletal:  Positive for back pain.       Pain in both feet, both shoulders, both hands, both legs spasms  Neurological:  Positive for dizziness, weakness and numbness.       Tingling  Psychiatric/Behavioral:  Positive for confusion.        Depression & anxiety  All other systems reviewed and are negative.     Objective:   Physical Exam  Awake, alert, appropriate, sitting on table; NAD  MS: Thenar eminence doesn't appear to be atrophied B/L -  UE- deltoid 5-/5; biceps 5-/5; limited due to pain- triceps 5-/5; WE 4+/5 B/L  grip 4+/5 B/L and FA 5-/5 B/L   LE's- HF/KE/KF/DF and PF 5-/5 except L HF 4+/5 but limited due to pain throughout- esp L hip   Rotation and/or extension causes no increased pain, however lumbar flexion increased pain; (-) SLR B/L  TTP over lumbar paraspinals- lower lumbar L>>R and TTP midline over lumbar spine.   Neuro: Decreased to light touch in B/L feet in stocking pattern - changes in upper ankles B/L  No sensory changes in hands B/L- radial aspect of hand same as ulnar aspect and both sides of 4th digit feel the same to pt- no sensory deficits B/L         Assessment & Plan:   Pt is a 56 yr old female with hx of DM2 with  A1c of 7.3 since 2019 and depression, and B/L carpal tunnel syndrome as well as diabetic neuropathy in feet-  And chronic back pain here for evaluation of pain issues.    Will restart Gabapentin-  300 mg 2x/day- x 5 days, then increase to 600 mg 2x/day- can increase after that- max  dose is 3600 mg/day, however will see if you tolerate it-  2. Will continue Flexeril 10 mg 3x/day as needed for muscle spasms.  3. Tennis ball- hold pressure for a minimum of 4 minutes-  If use on buttock, sit on pillow then tennis ball- not massage- this is pressure- first 30 seconds, the muscle tightens up first, then relaxes.    4.  Says tramadol- doesn't work for her.   5. Continue Cymbalta/Duloxetine- 60 mg daily- has enough right now, but can refill in future.   6.  Xrays of lumbar spine. [address] [facility]- can just walk in.    7. Depending on results of Xrays, can decide if needs MRI- however no red flags.    8. Waiting on opiates at this time-   9. F/U in 3 months- but call me in 1 month to let me know how things going.    I spent  46    minutes on appointment today; more than 50% of that time was spent on educating patient as per details/plan above. Discussing how opiates are not my first choice, eps with neuropathy, but will continue to titrate up and try different meds.

## 2021-03-26 NOTE — Patient Instructions (Signed)
Pt is a 56 yr old female with hx of DM2 with A1c of 7.3 since 2019 and depression, and B/L carpal tunnel syndrome as well as diabetic neuropathy in feet-  And chronic back pain here for evaluation of pain issues.    Will restart Gabapentin-  300 mg 2x/day- x 5 days, then increase to 600 mg 2x/day- can increase after that- max dose is 3600 mg/day, however will see if you tolerate it-  2. Will continue Flexeril 10 mg 3x/day as needed for muscle spasms.  3. Tennis ball- hold pressure for a minimum of 4 minutes-  If use on buttock, sit on pillow then tennis ball- not massage- this is pressure- first 30 seconds, the muscle tightens up first, then relaxes.    4.  Says tramadol- doesn't work for her.   5. Continue Cymbalta/Duloxetine- 60 mg daily- has enough right now, but can refill in future.   6.  Xrays of lumbar spine. [place] [facility] [address] [facility]- can just walk in.    7. Depending on results of Xrays, can decide if needs MRI- however no red flags.    8. Waiting on opiates at this time-   9. F/U in 3 months- but call me in 1 month to let me know how things going.

## 2021-03-29 ENCOUNTER — Inpatient Hospital Stay: Admission: RE | Admit: 2021-03-29 | Payer: 59 | Source: Ambulatory Visit

## 2021-03-30 ENCOUNTER — Telehealth: Payer: Self-pay | Admitting: *Deleted

## 2021-03-30 NOTE — Telephone Encounter (Signed)
Renee Reilly calling for her x ray results.

## 2021-03-31 ENCOUNTER — Other Ambulatory Visit: Payer: Self-pay

## 2021-03-31 ENCOUNTER — Ambulatory Visit: Payer: 59 | Admitting: Internal Medicine

## 2021-03-31 ENCOUNTER — Other Ambulatory Visit: Payer: Self-pay | Admitting: *Deleted

## 2021-03-31 ENCOUNTER — Encounter: Payer: Self-pay | Admitting: Internal Medicine

## 2021-03-31 ENCOUNTER — Encounter: Payer: Self-pay | Admitting: *Deleted

## 2021-03-31 ENCOUNTER — Telehealth: Payer: Self-pay | Admitting: *Deleted

## 2021-03-31 VITALS — BP 138/88 | HR 96 | Temp 96.6°F | Ht 65.0 in | Wt 205.4 lb

## 2021-03-31 DIAGNOSIS — K760 Fatty (change of) liver, not elsewhere classified: Secondary | ICD-10-CM | POA: Diagnosis not present

## 2021-03-31 DIAGNOSIS — Z1211 Encounter for screening for malignant neoplasm of colon: Secondary | ICD-10-CM | POA: Diagnosis not present

## 2021-03-31 DIAGNOSIS — R1319 Other dysphagia: Secondary | ICD-10-CM

## 2021-03-31 DIAGNOSIS — K219 Gastro-esophageal reflux disease without esophagitis: Secondary | ICD-10-CM | POA: Diagnosis not present

## 2021-03-31 MED ORDER — PEG 3350-KCL-NA BICARB-NACL 420 G PO SOLR: ORAL | 0 refills | Status: DC

## 2021-03-31 NOTE — Progress Notes (Unsigned)
Ruq

## 2021-03-31 NOTE — Patient Instructions (Signed)
For your chronic acid reflux and difficulty swallowing, we will schedule you for upper endoscopy to further evaluate.  I may perform esophageal dilation pending findings.  At the same time we will perform colonoscopy for colon cancer screening purposes.  For your fatty liver disease, I am going to check a baseline ultrasound with elastography to further evaluate.  We will continue to monitor this.  It was very nice meeting you today.  Dr. Marletta Lor  Nonalcoholic Fatty Liver Disease Diet, Adult Nonalcoholic fatty liver disease is a condition that causes fat to build up in and around the liver. The disease makes it harder for the liver to work the way that it should. Following a healthy diet can help to keep nonalcoholic fatty liver disease under control. It can also help to prevent or improve conditions that are associated with the disease, such as heart disease, diabetes, high blood pressure, and abnormal cholesterol levels. Along with regular exercise, this diet: Promotes weight loss. Helps to control blood sugar levels. Helps to improve the way that the body uses insulin. What are tips for following this plan? Reading food labels  Always check food labels for: The amount of saturated fat in a food. You should limit your intake of saturated fat. Saturated fat is found in foods that come from animals, including meat and dairy products such as butter, cheese, and whole milk. The amount of fiber in a food. You should choose high-fiber foods such as fruits, vegetables, and whole grains. Try to get 25-30 grams (g) of fiber a day.   Cooking When cooking, use heart-healthy oils that are high in monounsaturated fats. These include olive oil, canola oil, and avocado oil. Limit frying or deep-frying foods. Cook foods using healthy methods such as baking, boiling, steaming, and grilling instead. Meal planning You may want to keep track of how many calories you take in. Eating the right amount of  calories will help you achieve a healthy weight. Meeting with a registered dietitian can help you get started. Limit how often you eat takeout and fast food. These foods are usually very high in fat, salt, and sugar. Use the glycemic index (GI) to plan your meals. The index tells you how quickly a food will raise your blood sugar. Choose low-GI foods (GI less than 55). These foods take a longer time to raise blood sugar. A registered dietitian can help you identify foods lower on the GI scale. Lifestyle You may want to follow a Mediterranean diet. This diet includes a lot of vegetables, lean meats or fish, whole grains, fruits, and healthy oils and fats. What foods can I eat?    Fruits Bananas. Apples. Oranges. Grapes. Papaya. Mango. Pomegranate. Kiwi. Grapefruit. Cherries. Vegetables Lettuce. Spinach. Peas. Beets. Cauliflower. Cabbage. Broccoli. Carrots. Tomatoes. Squash. Eggplant. Herbs. Peppers. Onions. Cucumbers. Brussels sprouts. Yams and sweet potatoes. Beans. Lentils. Grains Whole wheat or whole-grain foods, including breads, crackers, cereals, and pasta. Stone-ground whole wheat. Unsweetened oatmeal. Bulgur. Barley. Quinoa. Brown or wild rice. Corn or whole wheat flour tortillas. Meats and other proteins Lean meats. Poultry. Tofu. Seafood and shellfish. Dairy Low-fat or fat-free dairy products, such as yogurt, cottage cheese, or cheese. Beverages Water. Sugar-free drinks. Tea. Coffee. Low-fat or skim milk. Milk alternatives, such as soy or almond milk. Real fruit juice. Fats and oils Avocado. Canola or olive oil. Nuts and nut butters. Seeds. Seasonings and condiments Mustard. Relish. Low-fat, low-sugar ketchup and barbecue sauce. Low-fat or fat-free mayonnaise. Sweets and desserts Sugar-free sweets. The items listed  above may not be a complete list of foods and beverages you can eat. Contact a dietitian for more information. What foods should I limit or avoid? Meats and other  proteins Limit red meat to 1-2 times a week. Dairy [employer]. Fats and oils Palm oil and coconut oil. Fried foods. Other foods Processed foods. Foods that contain a lot of salt or sodium. Sweets and desserts Sweets that contain sugar. Beverages Sweetened drinks, such as sweet tea, milkshakes, iced sweet drinks, and sodas. Alcohol. The items listed above may not be a complete list of foods and beverages you should avoid. Contact a dietitian for more information. Where to find more information The [employer] of Diabetes and Digestive and Kidney Diseases: [email or web address] Summary Nonalcoholic fatty liver disease is a condition that causes fat to build up in and around the liver. Following a healthy diet can help to keep nonalcoholic fatty liver disease under control. Your diet should be rich in fruits, vegetables, whole grains, and lean proteins. Limit your intake of saturated fat. Saturated fat is found in foods that come from animals, including meat and dairy products such as butter, cheese, and whole milk. This diet promotes weight loss, helps to control blood sugar levels, and helps to improve the way that the body uses insulin. This information is not intended to replace advice given to you by your health care provider. Make sure you discuss any questions you have with your health care provider. Document Revised: 06/01/2018 Document Reviewed: 03/01/2018 Elsevier Patient Education  2020 [employer].  At [facility] we value your feedback. You may receive a survey about your visit today. Please share your experience as we strive to create trusting relationships with our patients to provide genuine, compassionate, quality care.  We appreciate your understanding and patience as we review any laboratory studies, imaging, and other diagnostic tests that are ordered as we care for you. Our office policy is 5 business days for review of these results, and any emergent  or urgent results are addressed in a timely manner for your best interest. If you do not hear from our office in 1 week, please contact us.   We also encourage the use of MyChart, which contains your medical information for your review as well. If you are not enrolled in this feature, an access code is on this after visit summary for your convenience. Thank you for allowing [place] to be involved in your care.  It was great to see you today!  I hope you have a great rest of your Winter!    Hennie Duos. Marletta Lor, D.O. Gastroenterology and Hepatology [facility]

## 2021-03-31 NOTE — Progress Notes (Signed)
Primary Care Physician:  Sonny Masters, FNP Primary Gastroenterologist:  Dr. Marletta Lor  Chief Complaint  Patient presents with   fatty liver    HPI:   Renee Reilly is a 56 y.o. female who presents to clinic today by referral from her PCP Gilford Silvius for evaluation.  She has multiple GI complaints for me today.  She states that she has chronic GERD.  Notes acid reflux which is well controlled on omeprazole 20 mg daily.  Sometimes she has to take 2 tablets in a day depending on what she eats.  Also with chronic dysphagia.  States food will get stuck in her substernal region.  Occasional regurgitation.    She underwent CT chest/coronary calcium which I personally reviewed, showed circumferential mucosal thickening of the distal esophagus.  No previous EGD.  No chronic NSAID use.  No previous colonoscopy for colon cancer screening purposes.  Denies any family history of colorectal malignancy.  No melena hematochezia.  No unintentional weight loss.  Above-mentioned CT also showed hepatic steatosis.  Patient has history of diabetes, obesity, dyslipidemia.  Denies any previous or current alcohol use.  Does note her mother died of liver cancer unknown if primary or mets.  Also unsure if she had cirrhosis.   Past Medical History:  Diagnosis Date   Anxiety    CAD (coronary artery disease), native coronary artery    coronary Ca score of 7.1 and mild CAD  of the RCA with soft plaque < 50% in the mid RCA   Carpal tunnel syndrome    Depression    Hypertension     Past Surgical History:  Procedure Laterality Date   TUBAL LIGATION      Current Outpatient Medications  Medication Sig Dispense Refill   aspirin 81 MG chewable tablet Chew 81 mg by mouth daily.     atorvastatin (LIPITOR) 40 MG tablet Take 1 tablet (40 mg total) by mouth daily. 90 tablet 2   citalopram (CELEXA) 20 MG tablet Take 1 tablet (20 mg total) by mouth daily. 90 tablet 2   cyclobenzaprine (FLEXERIL) 10 MG tablet Take 1  tablet (10 mg total) by mouth 3 (three) times daily as needed for muscle spasms. 90 tablet 5   diphenhydrAMINE (BENADRYL) 25 MG tablet Take 25 mg by mouth every 6 (six) hours as needed.     DULoxetine (CYMBALTA) 60 MG capsule Take 1 capsule (60 mg total) by mouth daily. 90 capsule 2   fenofibrate (TRICOR) 145 MG tablet Take 1 tablet (145 mg total) by mouth daily. 90 tablet 2   gabapentin (NEURONTIN) 300 MG capsule Take 1 capsule (300 mg total) by mouth 2 (two) times daily. X 5 days, then 600 mg 2x/day- x 5 days then 600 mg TID for nerve pain/neuropathy 180 capsule 5   lisinopril (ZESTRIL) 10 MG tablet Take 1 tablet (10 mg total) by mouth daily. 90 tablet 2   metFORMIN (GLUCOPHAGE) 1000 MG tablet Take 1 tablet (1,000 mg total) by mouth 2 (two) times daily with a meal. 60 tablet 5   metoprolol tartrate (LOPRESSOR) 100 MG tablet Take 1 tablet (100 mg total) by mouth once for 1 dose. Take 1 tablet two hours prior to CT scan. 1 tablet 0   Multiple Vitamin (MULTIVITAMIN) tablet Take 1 tablet by mouth daily.     omeprazole (PRILOSEC) 20 MG capsule Take 1 capsule (20 mg total) by mouth daily. 90 capsule 2   traZODone (DESYREL) 100 MG tablet Take 1 tablet (100  mg total) by mouth at bedtime as needed for sleep. 30 tablet 0   Cholecalciferol (VITAMIN D3) 125 MCG (5000 UT) TABS Take by mouth. (Patient not taking: Reported on 03/26/2021)     No current facility-administered medications for this visit.    Allergies as of 03/31/2021 - Review Complete 03/31/2021  Allergen Reaction Noted   Penicillins Anaphylaxis 02/11/2016    Family History  Problem Relation Age of Onset   Cancer Mother        Liver cancer   Diabetes Brother    Cancer Other     Social History   Socioeconomic History   Marital status: Widowed    Spouse name: Not on file   Number of children: Not on file   Years of education: Not on file   Highest education level: Not on file  Occupational History   Not on file  Tobacco Use    Smoking status: Every Day    Packs/day: 0.25    Types: Cigarettes   Smokeless tobacco: Never  Vaping Use   Vaping Use: Never used  Substance and Sexual Activity   Alcohol use: Yes    Comment: occas   Drug use: No   Sexual activity: Yes    Birth control/protection: Surgical  Other Topics Concern   Not on file  Social History Narrative   Not on file   Social Determinants of Health   Financial Resource Strain: Not on file  Food Insecurity: Not on file  Transportation Needs: Not on file  Physical Activity: Not on file  Stress: Not on file  Social Connections: Not on file  Intimate Partner Violence: Not on file    Subjective: Review of Systems  Constitutional:  Negative for chills and fever.  HENT:  Negative for congestion and hearing loss.   Eyes:  Negative for blurred vision and double vision.  Respiratory:  Negative for cough and shortness of breath.   Cardiovascular:  Negative for chest pain and palpitations.  Gastrointestinal:  Positive for heartburn. Negative for abdominal pain, blood in stool, constipation, diarrhea, melena and vomiting.       Dysphagia  Genitourinary:  Negative for dysuria and urgency.  Musculoskeletal:  Negative for joint pain and myalgias.  Skin:  Negative for itching and rash.  Neurological:  Negative for dizziness and headaches.  Psychiatric/Behavioral:  Negative for depression. The patient is not nervous/anxious.       Objective: BP 138/88    Pulse 96    Temp (!) 96.6 F (35.9 C) (Temporal)    Ht 5\' 5"  (1.651 m)    Wt 205 lb 6.4 oz (93.2 kg)    LMP  (LMP Unknown)    BMI 34.18 kg/m  Physical Exam Constitutional:      Appearance: Normal appearance. She is obese.  HENT:     Head: Normocephalic and atraumatic.  Eyes:     Extraocular Movements: Extraocular movements intact.     Conjunctiva/sclera: Conjunctivae normal.  Cardiovascular:     Rate and Rhythm: Normal rate and regular rhythm.  Pulmonary:     Effort: Pulmonary effort is normal.      Breath sounds: Normal breath sounds.  Abdominal:     General: Bowel sounds are normal.     Palpations: Abdomen is soft.  Musculoskeletal:        General: No swelling. Normal range of motion.     Cervical back: Normal range of motion and neck supple.  Skin:    General: Skin is warm and dry.  Coloration: Skin is not jaundiced.  Neurological:     General: No focal deficit present.     Mental Status: She is alert and oriented to person, place, and time.  Psychiatric:        Mood and Affect: Mood normal.        Behavior: Behavior normal.     Assessment: *GERD *Dysphagia *Nonalcoholic fatty liver disease *Colon cancer screening *Abnormal CT imaging of esophagus  Plan: Will schedule for EGD with possible dilation to evaluate for peptic ulcer disease, esophagitis, gastritis, H. Pylori, duodenitis, or other. Will also evaluate for esophageal stricture, Schatzki's ring, esophageal web or other.   At the same time we will perform colonoscopy for colon cancer screening purposes.  The risks including infection, bleed, or perforation as well as benefits, limitations, alternatives and imponderables have been reviewed with the patient. Potential for esophageal dilation, biopsy, etc. have also been reviewed.  Questions have been answered. All parties agreeable.  Discussed fatty liver in depth with patient today. NAFLD score -.63 indeterminate, FIB4 1.25.  We will check baseline ultrasound with elastography today.  Counseled on importance of keeping her diabetes and high cholesterol under good control.  Recommend 1-2# weight loss per week until ideal body weight through exercise & diet. Low fat/cholesterol diet.   Avoid sweets, sodas, fruit juices, sweetened beverages like tea, etc. Gradually increase exercise from 15 min daily up to 1 hr per day 5 days/week. Limit alcohol use.  Thank you Darla Lesches for the kind referral.    03/31/2021 10:38 AM   Disclaimer: This note was  dictated with voice recognition software. Similar sounding words can inadvertently be transcribed and may not be corrected upon review.

## 2021-03-31 NOTE — H&P (View-Only) (Signed)
° ° °Primary Care Physician:  Rakes, Linda M, FNP °Primary Gastroenterologist:  Dr. Mirian Casco ° °Chief Complaint  °Patient presents with  ° fatty liver  ° ° °HPI:   °Renee Reilly is a 56 y.o. female who presents to clinic today by referral from her PCP Linda Rakes for evaluation.  She has multiple GI complaints for me today.  She states that she has chronic GERD.  Notes acid reflux which is well controlled on omeprazole 20 mg daily.  Sometimes she has to take 2 tablets in a day depending on what she eats.  Also with chronic dysphagia.  States food will get stuck in her substernal region.  Occasional regurgitation.   ° °She underwent CT chest/coronary calcium which I personally reviewed, showed circumferential mucosal thickening of the distal esophagus.  No previous EGD.  No chronic NSAID use. ° °No previous colonoscopy for colon cancer screening purposes.  Denies any family history of colorectal malignancy.  No melena hematochezia.  No unintentional weight loss. ° °Above-mentioned CT also showed hepatic steatosis.  Patient has history of diabetes, obesity, dyslipidemia.  Denies any previous or current alcohol use.  Does note her mother died of liver cancer unknown if primary or mets.  Also unsure if she had cirrhosis. ° ° °Past Medical History:  °Diagnosis Date  ° Anxiety   ° CAD (coronary artery disease), native coronary artery   ° coronary Ca score of 7.1 and mild CAD  of the RCA with soft plaque < 50% in the mid RCA  ° Carpal tunnel syndrome   ° Depression   ° Hypertension   ° ° °Past Surgical History:  °Procedure Laterality Date  ° TUBAL LIGATION    ° ° °Current Outpatient Medications  °Medication Sig Dispense Refill  ° aspirin 81 MG chewable tablet Chew 81 mg by mouth daily.    ° atorvastatin (LIPITOR) 40 MG tablet Take 1 tablet (40 mg total) by mouth daily. 90 tablet 2  ° citalopram (CELEXA) 20 MG tablet Take 1 tablet (20 mg total) by mouth daily. 90 tablet 2  ° cyclobenzaprine (FLEXERIL) 10 MG tablet Take 1  tablet (10 mg total) by mouth 3 (three) times daily as needed for muscle spasms. 90 tablet 5  ° diphenhydrAMINE (BENADRYL) 25 MG tablet Take 25 mg by mouth every 6 (six) hours as needed.    ° DULoxetine (CYMBALTA) 60 MG capsule Take 1 capsule (60 mg total) by mouth daily. 90 capsule 2  ° fenofibrate (TRICOR) 145 MG tablet Take 1 tablet (145 mg total) by mouth daily. 90 tablet 2  ° gabapentin (NEURONTIN) 300 MG capsule Take 1 capsule (300 mg total) by mouth 2 (two) times daily. X 5 days, then 600 mg 2x/day- x 5 days then 600 mg TID for nerve pain/neuropathy 180 capsule 5  ° lisinopril (ZESTRIL) 10 MG tablet Take 1 tablet (10 mg total) by mouth daily. 90 tablet 2  ° metFORMIN (GLUCOPHAGE) 1000 MG tablet Take 1 tablet (1,000 mg total) by mouth 2 (two) times daily with a meal. 60 tablet 5  ° metoprolol tartrate (LOPRESSOR) 100 MG tablet Take 1 tablet (100 mg total) by mouth once for 1 dose. Take 1 tablet two hours prior to CT scan. 1 tablet 0  ° Multiple Vitamin (MULTIVITAMIN) tablet Take 1 tablet by mouth daily.    ° omeprazole (PRILOSEC) 20 MG capsule Take 1 capsule (20 mg total) by mouth daily. 90 capsule 2  ° traZODone (DESYREL) 100 MG tablet Take 1 tablet (100   mg total) by mouth at bedtime as needed for sleep. 30 tablet 0   Cholecalciferol (VITAMIN D3) 125 MCG (5000 UT) TABS Take by mouth. (Patient not taking: Reported on 03/26/2021)     No current facility-administered medications for this visit.    Allergies as of 03/31/2021 - Review Complete 03/31/2021  Allergen Reaction Noted   Penicillins Anaphylaxis 02/11/2016    Family History  Problem Relation Age of Onset   Cancer Mother        Liver cancer   Diabetes Brother    Cancer Other     Social History   Socioeconomic History   Marital status: Widowed    Spouse name: Not on file   Number of children: Not on file   Years of education: Not on file   Highest education level: Not on file  Occupational History   Not on file  Tobacco Use    Smoking status: Every Day    Packs/day: 0.25    Types: Cigarettes   Smokeless tobacco: Never  Vaping Use   Vaping Use: Never used  Substance and Sexual Activity   Alcohol use: Yes    Comment: occas   Drug use: No   Sexual activity: Yes    Birth control/protection: Surgical  Other Topics Concern   Not on file  Social History Narrative   Not on file   Social Determinants of Health   Financial Resource Strain: Not on file  Food Insecurity: Not on file  Transportation Needs: Not on file  Physical Activity: Not on file  Stress: Not on file  Social Connections: Not on file  Intimate Partner Violence: Not on file    Subjective: Review of Systems  Constitutional:  Negative for chills and fever.  HENT:  Negative for congestion and hearing loss.   Eyes:  Negative for blurred vision and double vision.  Respiratory:  Negative for cough and shortness of breath.   Cardiovascular:  Negative for chest pain and palpitations.  Gastrointestinal:  Positive for heartburn. Negative for abdominal pain, blood in stool, constipation, diarrhea, melena and vomiting.       Dysphagia  Genitourinary:  Negative for dysuria and urgency.  Musculoskeletal:  Negative for joint pain and myalgias.  Skin:  Negative for itching and rash.  Neurological:  Negative for dizziness and headaches.  Psychiatric/Behavioral:  Negative for depression. The patient is not nervous/anxious.       Objective: BP 138/88    Pulse 96    Temp (!) 96.6 F (35.9 C) (Temporal)    Ht 5\' 5"  (1.651 m)    Wt 205 lb 6.4 oz (93.2 kg)    LMP  (LMP Unknown)    BMI 34.18 kg/m  Physical Exam Constitutional:      Appearance: Normal appearance. She is obese.  HENT:     Head: Normocephalic and atraumatic.  Eyes:     Extraocular Movements: Extraocular movements intact.     Conjunctiva/sclera: Conjunctivae normal.  Cardiovascular:     Rate and Rhythm: Normal rate and regular rhythm.  Pulmonary:     Effort: Pulmonary effort is normal.      Breath sounds: Normal breath sounds.  Abdominal:     General: Bowel sounds are normal.     Palpations: Abdomen is soft.  Musculoskeletal:        General: No swelling. Normal range of motion.     Cervical back: Normal range of motion and neck supple.  Skin:    General: Skin is warm and dry.  Coloration: Skin is not jaundiced.  Neurological:     General: No focal deficit present.     Mental Status: She is alert and oriented to person, place, and time.  Psychiatric:        Mood and Affect: Mood normal.        Behavior: Behavior normal.     Assessment: *GERD *Dysphagia *Nonalcoholic fatty liver disease *Colon cancer screening *Abnormal CT imaging of esophagus  Plan: Will schedule for EGD with possible dilation to evaluate for peptic ulcer disease, esophagitis, gastritis, H. Pylori, duodenitis, or other. Will also evaluate for esophageal stricture, Schatzki's ring, esophageal web or other.   At the same time we will perform colonoscopy for colon cancer screening purposes.  The risks including infection, bleed, or perforation as well as benefits, limitations, alternatives and imponderables have been reviewed with the patient. Potential for esophageal dilation, biopsy, etc. have also been reviewed.  Questions have been answered. All parties agreeable.  Discussed fatty liver in depth with patient today. NAFLD score -.63 indeterminate, FIB4 1.25.  We will check baseline ultrasound with elastography today.  Counseled on importance of keeping her diabetes and high cholesterol under good control.  Recommend 1-2# weight loss per week until ideal body weight through exercise & diet. Low fat/cholesterol diet.   Avoid sweets, sodas, fruit juices, sweetened beverages like tea, etc. Gradually increase exercise from 15 min daily up to 1 hr per day 5 days/week. Limit alcohol use.  Thank you Darla Lesches for the kind referral.    03/31/2021 10:38 AM   Disclaimer: This note was  dictated with voice recognition software. Similar sounding words can inadvertently be transcribed and may not be corrected upon review.

## 2021-03-31 NOTE — Telephone Encounter (Signed)
Called pt. Aware of [place] appt details.  She has also been scheduled for TCS/EGD/DIL with propofol asa 3, on 3/2 at 8:15am. Aware will send rx prep to pharmacy, mail instructions and will call back with pre-op appt. She voiced understanding.

## 2021-04-01 LAB — HM DIABETES EYE EXAM

## 2021-04-05 ENCOUNTER — Ambulatory Visit (HOSPITAL_COMMUNITY): Admission: RE | Admit: 2021-04-05 | Payer: 59 | Source: Ambulatory Visit

## 2021-04-06 ENCOUNTER — Other Ambulatory Visit: Payer: Self-pay

## 2021-04-06 ENCOUNTER — Ambulatory Visit (HOSPITAL_COMMUNITY)
Admission: RE | Admit: 2021-04-06 | Discharge: 2021-04-06 | Disposition: A | Discharge status: Home / Self Care | Payer: 59 | Source: Ambulatory Visit | Attending: Internal Medicine | Admitting: Internal Medicine

## 2021-04-06 DIAGNOSIS — K760 Fatty (change of) liver, not elsewhere classified: Secondary | ICD-10-CM | POA: Diagnosis present

## 2021-04-09 ENCOUNTER — Encounter: Payer: 59 | Admitting: Family Medicine

## 2021-04-14 ENCOUNTER — Telehealth: Payer: Self-pay | Admitting: Family Medicine

## 2021-04-14 NOTE — Telephone Encounter (Signed)
Pt is aware Retinopathy exam was normal and Clinical Lead Toniann Fail) is printing off report for pt to pick up,left up front in drawer.

## 2021-04-16 NOTE — Patient Instructions (Signed)
Renee Reilly  04/16/2021     @PREFPERIOPPHARMACY @   Your procedure is scheduled on  04/22/2021.   Report to [facility] at  0700  A.M.   Call this number if you have problems the morning of surgery:  [phone]   Remember:  Follow the diet and prep instructions given to you by the office.    DO NOT take any medications for diabetes the morning of your procedure.    Take these medicines the morning of surgery with A SIP OF WATER        celexa, flexeril(if needed), cymbalta, gabapentin, metoprolol, prilosec.     Do not wear jewelry, make-up or nail polish.  Do not wear lotions, powders, or perfumes, or deodorant.  Do not shave 48 hours prior to surgery.  Men may shave face and neck.  Do not bring valuables to the hospital.  [facility] is not responsible for any belongings or valuables.  Contacts, dentures or bridgework may not be worn into surgery.  Leave your suitcase in the car.  After surgery it may be brought to your room.  For patients admitted to the hospital, discharge time will be determined by your treatment team.  Patients discharged the day of surgery will not be allowed to drive home and must have someone with them for 24 hours.    Special instructions:   DO NOT smoke tobacco or vape for 24 hours before your procedure.  Please read over the following fact sheets that you were given. Anesthesia Post-op Instructions and Care and Recovery After Surgery      Upper Endoscopy, Adult, Care After This sheet gives you information about how to care for yourself after your procedure. Your health care provider may also give you more specific instructions. If you have problems or questions, contact your health care provider. What can I expect after the procedure? After the procedure, it is common to have: A sore throat. Mild stomach pain or discomfort. Bloating. Nausea. Follow these instructions at home:  Follow instructions from your health care  provider about what to eat or drink after your procedure. Return to your normal activities as told by your health care provider. Ask your health care provider what activities are safe for you. Take over-the-counter and prescription medicines only as told by your health care provider. If you were given a sedative during the procedure, it can affect you for several hours. Do not drive or operate machinery until your health care provider says that it is safe. Keep all follow-up visits as told by your health care provider. This is important. Contact a health care provider if you have: A sore throat that lasts longer than one day. Trouble swallowing. Get help right away if: You vomit blood or your vomit looks like coffee grounds. You have: A fever. Bloody, black, or tarry stools. A severe sore throat or you cannot swallow. Difficulty breathing. Severe pain in your chest or abdomen. Summary After the procedure, it is common to have a sore throat, mild stomach discomfort, bloating, and nausea. If you were given a sedative during the procedure, it can affect you for several hours. Do not drive or operate machinery until your health care provider says that it is safe. Follow instructions from your health care provider about what to eat or drink after your procedure. Return to your normal activities as told by your health care provider. This information is not intended to replace advice  given to you by your health care provider. Make sure you discuss any questions you have with your health care provider. Document Revised: 12/14/2018 Document Reviewed: 07/10/2017 Elsevier Patient Education  2022 [employer] Esophageal Dilatation Esophageal dilatation, also called esophageal dilation, is a procedure to widen or open a blocked or narrowed part of the esophagus. The esophagus is the part of the body that moves food and liquid from the mouth to the stomach. You may need this procedure if: You have a  buildup of scar tissue in your esophagus that makes it difficult, painful, or impossible to swallow. This can be caused by gastroesophageal reflux disease (GERD). You have cancer of the esophagus. There is a problem with how food moves through your esophagus. In some cases, you may need this procedure repeated at a later time to dilate the esophagus gradually. Tell a health care provider about: Any allergies you have. All medicines you are taking, including vitamins, herbs, eye drops, creams, and over-the-counter medicines. Any problems you or family members have had with anesthetic medicines. Any blood disorders you have. Any surgeries you have had. Any medical conditions you have. Any antibiotic medicines you are required to take before dental procedures. Whether you are pregnant or may be pregnant. What are the risks? Generally, this is a safe procedure. However, problems may occur, including: Bleeding due to a tear in the lining of the esophagus. A hole, or perforation, in the esophagus. What happens before the procedure? Ask your health care provider about: Changing or stopping your regular medicines. This is especially important if you are taking diabetes medicines or blood thinners. Taking medicines such as aspirin and ibuprofen. These medicines can thin your blood. Do not take these medicines unless your health care provider tells you to take them. Taking over-the-counter medicines, vitamins, herbs, and supplements. Follow instructions from your health care provider about eating or drinking restrictions. Plan to have a responsible adult take you home from the hospital or clinic. Plan to have a responsible adult care for you for the time you are told after you leave the hospital or clinic. This is important. What happens during the procedure? You may be given a medicine to help you relax (sedative). A numbing medicine may be sprayed into the back of your throat, or you may gargle  the medicine. Your health care provider may perform the dilatation using various surgical instruments, such as: Simple dilators. This instrument is carefully placed in the esophagus to stretch it. Guided wire bougies. This involves using an endoscope to insert a wire into the esophagus. A dilator is passed over this wire to enlarge the esophagus. Then the wire is removed. Balloon dilators. An endoscope with a small balloon is inserted into the esophagus. The balloon is inflated to stretch the esophagus and open it up. The procedure may vary among health care providers and hospitals. What can I expect after the procedure? Your blood pressure, heart rate, breathing rate, and blood oxygen level will be monitored until you leave the hospital or clinic. Your throat may feel slightly sore and numb. This will get better over time. You will not be allowed to eat or drink until your throat is no longer numb. When you are able to drink, urinate, and sit on the edge of the bed without nausea or dizziness, you may be able to return home. Follow these instructions at home: Take over-the-counter and prescription medicines only as told by your health care provider. If you were given a sedative  during the procedure, it can affect you for several hours. Do not drive or operate machinery until your health care provider says that it is safe. Plan to have a responsible adult care for you for the time you are told. This is important. Follow instructions from your health care provider about any eating or drinking restrictions. Do not use any products that contain nicotine or tobacco, such as cigarettes, e-cigarettes, and chewing tobacco. If you need help quitting, ask your health care provider. Keep all follow-up visits. This is important. Contact a health care provider if: You have a fever. You have pain that is not relieved by medicine. Get help right away if: You have chest pain. You have trouble breathing. You  have trouble swallowing. You vomit blood. You have black, tarry, or bloody stools. These symptoms may represent a serious problem that is an emergency. Do not wait to see if the symptoms will go away. Get medical help right away. Call your local emergency services (911 in the [place] Do not drive yourself to the hospital. Summary Esophageal dilatation, also called esophageal dilation, is a procedure to widen or open a blocked or narrowed part of the esophagus. Plan to have a responsible adult take you home from the hospital or clinic. For this procedure, a numbing medicine may be sprayed into the back of your throat, or you may gargle the medicine. Do not drive or operate machinery until your health care provider says that it is safe. This information is not intended to replace advice given to you by your health care provider. Make sure you discuss any questions you have with your health care provider. Document Revised: 06/26/2019 Document Reviewed: 06/26/2019 Elsevier Patient Education  2022 [employer] Colonoscopy, Adult, Care After This sheet gives you information about how to care for yourself after your procedure. Your health care provider may also give you more specific instructions. If you have problems or questions, contact your health care provider. What can I expect after the procedure? After the procedure, it is common to have: A small amount of blood in your stool for 24 hours after the procedure. Some gas. Mild cramping or bloating of your abdomen. Follow these instructions at home: Eating and drinking  Drink enough fluid to keep your urine pale yellow. Follow instructions from your health care provider about eating or drinking restrictions. Resume your normal diet as instructed by your health care provider. Avoid heavy or fried foods that are hard to digest. Activity Rest as told by your health care provider. Avoid sitting for a long time without moving. Get up to take  short walks every 1-2 hours. This is important to improve blood flow and breathing. Ask for help if you feel weak or unsteady. Return to your normal activities as told by your health care provider. Ask your health care provider what activities are safe for you. Managing cramping and bloating  Try walking around when you have cramps or feel bloated. Apply heat to your abdomen as told by your health care provider. Use the heat source that your health care provider recommends, such as a moist heat pack or a heating pad. Place a towel between your skin and the heat source. Leave the heat on for 20-30 minutes. Remove the heat if your skin turns bright red. This is especially important if you are unable to feel pain, heat, or cold. You may have a greater risk of getting burned. General instructions If you were given a sedative during the procedure,  it can affect you for several hours. Do not drive or operate machinery until your health care provider says that it is safe. For the first 24 hours after the procedure: Do not sign important documents. Do not drink alcohol. Do your regular daily activities at a slower pace than normal. Eat soft foods that are easy to digest. Take over-the-counter and prescription medicines only as told by your health care provider. Keep all follow-up visits as told by your health care provider. This is important. Contact a health care provider if: You have blood in your stool 2-3 days after the procedure. Get help right away if you have: More than a small spotting of blood in your stool. Large blood clots in your stool. Swelling of your abdomen. Nausea or vomiting. A fever. Increasing pain in your abdomen that is not relieved with medicine. Summary After the procedure, it is common to have a small amount of blood in your stool. You may also have mild cramping and bloating of your abdomen. If you were given a sedative during the procedure, it can affect you for  several hours. Do not drive or operate machinery until your health care provider says that it is safe. Get help right away if you have a lot of blood in your stool, nausea or vomiting, a fever, or increased pain in your abdomen. This information is not intended to replace advice given to you by your health care provider. Make sure you discuss any questions you have with your health care provider. Document Revised: 12/14/2018 Document Reviewed: 09/03/2018 Elsevier Patient Education  2022 [employer]. Monitored Anesthesia Care, Care After This sheet gives you information about how to care for yourself after your procedure. Your health care provider may also give you more specific instructions. If you have problems or questions, contact your health care provider. What can I expect after the procedure? After the procedure, it is common to have: Tiredness. Forgetfulness about what happened after the procedure. Impaired judgment for important decisions. Nausea or vomiting. Some difficulty with balance. Follow these instructions at home: For the time period you were told by your health care provider:   Rest as needed. Do not participate in activities where you could fall or become injured. Do not drive or use machinery. Do not drink alcohol. Do not take sleeping pills or medicines that cause drowsiness. Do not make important decisions or sign legal documents. Do not take care of children on your own. Eating and drinking Follow the diet that is recommended by your health care provider. Drink enough fluid to keep your urine pale yellow. If you vomit: Drink water, juice, or soup when you can drink without vomiting. Make sure you have little or no nausea before eating solid foods. General instructions Have a responsible adult stay with you for the time you are told. It is important to have someone help care for you until you are awake and alert. Take over-the-counter and prescription medicines  only as told by your health care provider. If you have sleep apnea, surgery and certain medicines can increase your risk for breathing problems. Follow instructions from your health care provider about wearing your sleep device: Anytime you are sleeping, including during daytime naps. While taking prescription pain medicines, sleeping medicines, or medicines that make you drowsy. Avoid smoking. Keep all follow-up visits as told by your health care provider. This is important. Contact a health care provider if: You keep feeling nauseous or you keep vomiting. You feel light-headed. You are  still sleepy or having trouble with balance after 24 hours. You develop a rash. You have a fever. You have redness or swelling around the IV site. Get help right away if: You have trouble breathing. You have new-onset confusion at home. Summary For several hours after your procedure, you may feel tired. You may also be forgetful and have poor judgment. Have a responsible adult stay with you for the time you are told. It is important to have someone help care for you until you are awake and alert. Rest as told. Do not drive or operate machinery. Do not drink alcohol or take sleeping pills. Get help right away if you have trouble breathing, or if you suddenly become confused. This information is not intended to replace advice given to you by your health care provider. Make sure you discuss any questions you have with your health care provider. Document Revised: 10/24/2019 Document Reviewed: 01/10/2019 Elsevier Patient Education  2022 [employer].

## 2021-04-20 ENCOUNTER — Encounter (HOSPITAL_COMMUNITY)
Admission: RE | Admit: 2021-04-20 | Discharge: 2021-04-20 | Disposition: A | Discharge status: Home / Self Care | Payer: 59 | Source: Ambulatory Visit | Attending: Internal Medicine | Admitting: Internal Medicine

## 2021-04-20 ENCOUNTER — Encounter (HOSPITAL_COMMUNITY): Payer: Self-pay

## 2021-04-20 VITALS — BP 107/80 | HR 87 | Temp 96.6°F | Ht 65.0 in | Wt 205.4 lb

## 2021-04-20 DIAGNOSIS — E1169 Type 2 diabetes mellitus with other specified complication: Secondary | ICD-10-CM | POA: Diagnosis not present

## 2021-04-20 DIAGNOSIS — Z01812 Encounter for preprocedural laboratory examination: Secondary | ICD-10-CM | POA: Diagnosis present

## 2021-04-20 LAB — BASIC METABOLIC PANEL
Anion gap: 10 (ref 5–15)
BUN: 12 mg/dL (ref 6–20)
CO2: 25 mmol/L (ref 22–32)
Calcium: 10 mg/dL (ref 8.9–10.3)
Chloride: 102 mmol/L (ref 98–111)
Creatinine, Ser: 0.75 mg/dL (ref 0.44–1.00)
GFR, Estimated: >60 mL/min (ref >60)
Glucose, Bld: 179 mg/dL — ABNORMAL HIGH (ref 70–99)
Potassium: 4.9 mmol/L (ref 3.5–5.1)
Sodium: 137 mmol/L (ref 135–145)

## 2021-04-22 ENCOUNTER — Encounter (HOSPITAL_COMMUNITY): Payer: Self-pay

## 2021-04-22 ENCOUNTER — Ambulatory Visit (HOSPITAL_COMMUNITY)
Admission: RE | Admit: 2021-04-22 | Discharge: 2021-04-22 | Disposition: A | Discharge status: Home / Self Care | Payer: 59 | Attending: Internal Medicine | Admitting: Internal Medicine

## 2021-04-22 ENCOUNTER — Ambulatory Visit (HOSPITAL_COMMUNITY): Payer: 59 | Admitting: Anesthesiology

## 2021-04-22 ENCOUNTER — Ambulatory Visit (HOSPITAL_BASED_OUTPATIENT_CLINIC_OR_DEPARTMENT_OTHER): Payer: 59 | Admitting: Anesthesiology

## 2021-04-22 ENCOUNTER — Encounter (HOSPITAL_COMMUNITY)
Admission: RE | Disposition: A | Discharge status: Home / Self Care | Payer: Self-pay | Source: Home / Self Care | Attending: Internal Medicine

## 2021-04-22 DIAGNOSIS — Z1211 Encounter for screening for malignant neoplasm of colon: Secondary | ICD-10-CM | POA: Diagnosis not present

## 2021-04-22 DIAGNOSIS — K648 Other hemorrhoids: Secondary | ICD-10-CM

## 2021-04-22 DIAGNOSIS — R1310 Dysphagia, unspecified: Secondary | ICD-10-CM

## 2021-04-22 DIAGNOSIS — K3189 Other diseases of stomach and duodenum: Secondary | ICD-10-CM

## 2021-04-22 HISTORY — PX: BIOPSY: SHX5522

## 2021-04-22 HISTORY — PX: ESOPHAGOGASTRODUODENOSCOPY (EGD) WITH PROPOFOL: SHX5813

## 2021-04-22 HISTORY — PX: COLONOSCOPY WITH PROPOFOL: SHX5780

## 2021-04-22 HISTORY — PX: BALLOON DILATION: SHX5330

## 2021-04-22 LAB — GLUCOSE, CAPILLARY: Glucose-Capillary: 137 mg/dL — ABNORMAL HIGH (ref 70–99)

## 2021-04-22 SURGERY — COLONOSCOPY WITH PROPOFOL
Anesthesia: General

## 2021-04-22 MED ORDER — OMEPRAZOLE 40 MG PO CPDR: 40.0000 mg | DELAYED_RELEASE_CAPSULE | Freq: Every day | ORAL | 5 refills | Status: DC

## 2021-04-22 MED ORDER — PROPOFOL 10 MG/ML IV BOLUS
INTRAVENOUS | Status: DC | PRN
Start: 2021-04-22 — End: 2021-04-22
  Administered 2021-04-22: 100 mg via INTRAVENOUS
  Administered 2021-04-22 (×2): 50 mg via INTRAVENOUS

## 2021-04-22 MED ORDER — LACTATED RINGERS IV SOLN
INTRAVENOUS | Status: DC
  Administered 2021-04-22: 1000 mL via INTRAVENOUS

## 2021-04-22 MED ORDER — PROPOFOL 500 MG/50ML IV EMUL
INTRAVENOUS | Status: DC | PRN
  Administered 2021-04-22: 150 ug/kg/min via INTRAVENOUS

## 2021-04-22 MED ORDER — DEXMEDETOMIDINE (PRECEDEX) IN NS 20 MCG/5ML (4 MCG/ML) IV SYRINGE
PREFILLED_SYRINGE | INTRAVENOUS | Status: DC | PRN
  Administered 2021-04-22: 20 ug via INTRAVENOUS

## 2021-04-22 MED ORDER — LIDOCAINE HCL (CARDIAC) PF 100 MG/5ML IV SOSY
PREFILLED_SYRINGE | INTRAVENOUS | Status: DC | PRN
  Administered 2021-04-22: 50 mg via INTRAVENOUS

## 2021-04-22 MED ORDER — DEXMEDETOMIDINE (PRECEDEX) IN NS 20 MCG/5ML (4 MCG/ML) IV SYRINGE
PREFILLED_SYRINGE | INTRAVENOUS | Status: AC
  Filled 2021-04-22: qty 5

## 2021-04-22 NOTE — Discharge Instructions (Signed)
EGD ?Discharge instructions ?Please read the instructions outlined below and refer to this sheet in the next few weeks. These discharge instructions provide you with general information on caring for yourself after you leave the hospital. Your doctor may also give you specific instructions. While your treatment has been planned according to the most current medical practices available, unavoidable complications occasionally occur. If you have any problems or questions after discharge, please call your doctor. ?ACTIVITY ?You may resume your regular activity but move at a slower pace for the next 24 hours.  ?Take frequent rest periods for the next 24 hours.  ?Walking will help expel (get rid of) the air and reduce the bloated feeling in your abdomen.  ?No driving for 24 hours (because of the anesthesia (medicine) used during the test).  ?You may shower.  ?Do not sign any important legal documents or operate any machinery for 24 hours (because of the anesthesia used during the test).  ?NUTRITION ?Drink plenty of fluids.  ?You may resume your normal diet.  ?Begin with a light meal and progress to your normal diet.  ?Avoid alcoholic beverages for 24 hours or as instructed by your caregiver.  ?MEDICATIONS ?You may resume your normal medications unless your caregiver tells you otherwise.  ?WHAT YOU CAN EXPECT TODAY ?You may experience abdominal discomfort such as a feeling of fullness or ?gas? pains.  ?FOLLOW-UP ?Your doctor will discuss the results of your test with you.  ?SEEK IMMEDIATE MEDICAL ATTENTION IF ANY OF THE FOLLOWING OCCUR: ?Excessive nausea (feeling sick to your stomach) and/or vomiting.  ?Severe abdominal pain and distention (swelling).  ?Trouble swallowing.  ?Temperature over 101? F (37.8? C).  ?Rectal bleeding or vomiting of blood.  ? ? ? ?Colonoscopy ?Discharge Instructions ? ?Read the instructions outlined below and refer to this sheet in the next few weeks. These discharge instructions provide you with  general information on caring for yourself after you leave the hospital. Your doctor may also give you specific instructions. While your treatment has been planned according to the most current medical practices available, unavoidable complications occasionally occur.  ? ?ACTIVITY ?You may resume your regular activity, but move at a slower pace for the next 24 hours.  ?Take frequent rest periods for the next 24 hours.  ?Walking will help get rid of the air and reduce the bloated feeling in your belly (abdomen).  ?No driving for 24 hours (because of the medicine (anesthesia) used during the test).   ?Do not sign any important legal documents or operate any machinery for 24 hours (because of the anesthesia used during the test).  ?NUTRITION ?Drink plenty of fluids.  ?You may resume your normal diet as instructed by your doctor.  ?Begin with a light meal and progress to your normal diet. Heavy or fried foods are harder to digest and may make you feel sick to your stomach (nauseated).  ?Avoid alcoholic beverages for 24 hours or as instructed.  ?MEDICATIONS ?You may resume your normal medications unless your doctor tells you otherwise.  ?WHAT YOU CAN EXPECT TODAY ?Some feelings of bloating in the abdomen.  ?Passage of more gas than usual.  ?Spotting of blood in your stool or on the toilet paper.  ?IF YOU HAD POLYPS REMOVED DURING THE COLONOSCOPY: ?No aspirin products for 7 days or as instructed.  ?No alcohol for 7 days or as instructed.  ?Eat a soft diet for the next 24 hours.  ?FINDING OUT THE RESULTS OF YOUR TEST ?Not all test results are  available during your visit. If your test results are not back during the visit, make an appointment with your caregiver to find out the results. Do not assume everything is normal if you have not heard from your caregiver or the medical facility. It is important for you to follow up on all of your test results.  ?SEEK IMMEDIATE MEDICAL ATTENTION IF: ?You have more than a spotting of  blood in your stool.  ?Your belly is swollen (abdominal distention).  ?You are nauseated or vomiting.  ?You have a temperature over 101.  ?You have abdominal pain or discomfort that is severe or gets worse throughout the day.  ? ?Your EGD revealed mild amount inflammation in your stomach.  I took biopsies of this to rule out infection with a bacteria called H. pylori.  Await pathology results, my office will contact you.  I did stretch your esophagus.  Hopefully this helps with your swallowing.  I am going to increase your omeprazole to 40 mg daily and have sent this to your pharmacy.  We may need to consider esophageal manometry if symptoms are not improved status post dilation and increase in medication. ? ?Your colonoscopy was relatively unremarkable.  I did not find any polyps or evidence of colon cancer.  I recommend repeating colonoscopy in 10 years for colon cancer screening purposes.   ? ?Follow-up with GI in 4 months for ? ? ? ?I hope you have a great rest of your week! ? ?Hennie Duos. Marletta Lor, D.O. ?Gastroenterology and Hepatology ?[facility] ? ?

## 2021-04-22 NOTE — Transfer of Care (Signed)
Immediate Anesthesia Transfer of Care Note ? ?Patient: Renee Reilly ? ?Procedure(s) Performed: COLONOSCOPY WITH PROPOFOL ?ESOPHAGOGASTRODUODENOSCOPY (EGD) WITH PROPOFOL ?BALLOON DILATION ?BIOPSY ? ?Patient Location: Short Stay ? ?Anesthesia Type:General ? ?Level of Consciousness: awake ? ?Airway & Oxygen Therapy: Patient Spontanous Breathing ? ?Post-op Assessment: Report given to RN and Post -op Vital signs reviewed and stable ? ?Post vital signs: Reviewed and stable ? ?Last Vitals:  ?Vitals Value Taken Time  ?BP    ?Temp    ?Pulse    ?Resp    ?SpO2    ? ? ?Last Pain:  ?Vitals:  ? 04/22/21 0842  ?TempSrc:   ?PainSc: 0-No pain  ?   ? ?Patients Stated Pain Goal: 9 (04/22/21 0716) ? ?Complications: No notable events documented. ?

## 2021-04-22 NOTE — Op Note (Signed)
[facility] ?Patient Name: Renee Reilly ?Procedure Date: 04/22/2021 8:34 AM ?MRN: [id number] ?Date of Birth: 12-Jun-1965 ?Attending MD: Elon Alas. Abbey Chatters , DO ?CSN: [id number] ?Age: 56 ?Admit Type: Outpatient ?Procedure:                Upper GI endoscopy ?Indications:              Dysphagia ?Providers:                Elon Alas. Abbey Chatters, DO, Janeece Riggers, RN, Emilee  ?                          Lynnell Grain RN, RN, Raphael Gibney, Technician ?Referring MD:              ?Medicines:                See the Anesthesia note for documentation of the  ?                          administered medications ?Complications:            No immediate complications. ?Estimated Blood Loss:     Estimated blood loss was minimal. ?Procedure:                Pre-Anesthesia Assessment: ?                          - The anesthesia plan was to use monitored  ?                          anesthesia care (MAC). ?                          After obtaining informed consent, the endoscope was  ?                          passed under direct vision. Throughout the  ?                          procedure, the patient's blood pressure, pulse, and  ?                          oxygen saturations were monitored continuously. The  ?                          GIF-H190 ([id number]) scope was introduced through the  ?                          mouth, and advanced to the second part of duodenum.  ?                          The upper GI endoscopy was accomplished without  ?                          difficulty. The patient tolerated the procedure  ?                          well. ?Scope In:  8:48:30 AM ?Scope Out: 8:52:53 AM ?Total Procedure Duration: 0 hours 4 minutes 23 seconds  ?Findings: ?     There is no endoscopic evidence of areas of erosion, esophagitis, hiatal  ?     hernia, inflammation, ulcerations or varices in the entire esophagus. ?     No endoscopic abnormality was evident in the esophagus to explain the  ?     patient's complaint of dysphagia. Preparations were made  for empiric  ?     dilation. A TTS dilator was passed through the scope. Dilation with an  ?     18-19-20 mm balloon dilator was performed to 20 mm. Dilation was  ?     performed with a mild resistance at 20 mm. Estimated blood loss was none. ?     Patchy mildly erythematous mucosa without bleeding was found in the  ?     gastric body and in the gastric antrum. Biopsies were taken with a cold  ?     forceps for Helicobacter pylori testing. ?     The duodenal bulb, first portion of the duodenum and second portion of  ?     the duodenum were normal. ?Impression:               - Erythematous mucosa in the gastric body and  ?                          antrum. Biopsied. ?                          - Normal duodenal bulb, first portion of the  ?                          duodenum and second portion of the duodenum. ?Moderate Sedation: ?     Per Anesthesia Care ?Recommendation:           - Patient has a contact number available for  ?                          emergencies. The signs and symptoms of potential  ?                          delayed complications were discussed with the  ?                          patient. Return to normal activities tomorrow.  ?                          Written discharge instructions were provided to the  ?                          patient. ?                          - Resume previous diet. ?                          - Continue present medications. ?                          -  Await pathology results. ?                          - Repeat upper endoscopy PRN for retreatment. ?                          - Return to GI clinic in 4 months. ?                          - Use Prilosec (omeprazole) 40 mg PO daily. ?                          - Consider esophageal manometry if symptoms not  ?                          improved s/p dilation and increase in PPI ?Procedure Code(s):        --- Professional --- ?                          [id number], Esophagogastroduodenoscopy, flexible,  ?                           transoral; with biopsy, single or multiple ?Diagnosis Code(s):        --- Professional --- ?                          K31.89, Other diseases of stomach and duodenum ?                          R13.10, Dysphagia, unspecified ?CPT copyright 2019 American Medical Association. All rights reserved. ?The codes documented in this report are preliminary and upon coder review may  ?be revised to meet current compliance requirements. ?Elon Alas. Abbey Chatters, DO ?Elon Alas. Abbey Chatters, DO ?04/22/2021 8:58:18 AM ?This report has been signed electronically. ?Number of Addenda: 0 ?

## 2021-04-22 NOTE — Anesthesia Procedure Notes (Signed)
Date/Time: 04/22/2021 8:49 AM ?Performed by: Orlie Dakin, CRNA ?Pre-anesthesia Checklist: Emergency Drugs available, Patient identified, Suction available and Patient being monitored ?Patient Re-evaluated:Patient Re-evaluated prior to induction ?Oxygen Delivery Method: Nasal cannula ?Induction Type: IV induction ?Placement Confirmation: positive ETCO2 ? ? ? ? ?

## 2021-04-22 NOTE — Op Note (Signed)
[facility] ?Patient Name: Renee Reilly ?Procedure Date: 04/22/2021 8:34 AM ?MRN: [id number] ?Date of Birth: Mar 08, 1965 ?Attending MD: Elon Alas. Abbey Chatters , DO ?CSN: [id number] ?Age: 56 ?Admit Type: Outpatient ?Procedure:                Colonoscopy ?Indications:              Screening for colorectal malignant neoplasm ?Providers:                Elon Alas. Abbey Chatters, DO, Hughie Closs RN, RN, Cathi Roan  ?                          Gloriann Loan, RN, Raphael Gibney, Technician ?Referring MD:              ?Medicines:                See the Anesthesia note for documentation of the  ?                          administered medications ?Complications:            No immediate complications. ?Estimated Blood Loss:     Estimated blood loss: none. ?Procedure:                Pre-Anesthesia Assessment: ?                          - The anesthesia plan was to use monitored  ?                          anesthesia care (MAC). ?                          After obtaining informed consent, the colonoscope  ?                          was passed under direct vision. Throughout the  ?                          procedure, the patient's blood pressure, pulse, and  ?                          oxygen saturations were monitored continuously. The  ?                          PCF-HQ190L ([id number]) scope was introduced through  ?                          the anus and advanced to the the cecum, identified  ?                          by appendiceal orifice and ileocecal valve. The  ?                          colonoscopy was performed without difficulty. The  ?                          patient tolerated the  procedure well. The quality  ?                          of the bowel preparation was evaluated using the  ?                          BBPS [facility] Bowel Preparation Scale) with scores  ?                          of: Right Colon = 2 (minor amount of residual  ?                          staining, small fragments of stool and/or opaque  ?                          liquid,  but mucosa seen well), Transverse Colon = 2  ?                          (minor amount of residual staining, small fragments  ?                          of stool and/or opaque liquid, but mucosa seen  ?                          well) and Left Colon = 2 (minor amount of residual  ?                          staining, small fragments of stool and/or opaque  ?                          liquid, but mucosa seen well). The total BBPS score  ?                          equals 6. The quality of the bowel preparation was  ?                          fair. ?Scope In: 8:59:07 AM ?Scope Out: 9:15:49 AM ?Scope Withdrawal Time: 0 hours 13 minutes 50 seconds  ?Total Procedure Duration: 0 hours 16 minutes 42 seconds  ?Findings: ?     The perianal and digital rectal examinations were normal. ?     Non-bleeding internal hemorrhoids were found during endoscopy. ?     A moderate amount of stool was found in the entire colon, making  ?     visualization difficult. Lavage of the area was performed using copious  ?     amounts of sterile water, resulting in clearance with fair visualization. ?     The exam was otherwise without abnormality. ?Impression:               - Preparation of the colon was fair. ?                          - Non-bleeding internal hemorrhoids. ?                          -  Stool in the entire examined colon. ?                          - The examination was otherwise normal. ?                          - No specimens collected. ?Moderate Sedation: ?     Per Anesthesia Care ?Recommendation:           - Patient has a contact number available for  ?                          emergencies. The signs and symptoms of potential  ?                          delayed complications were discussed with the  ?                          patient. Return to normal activities tomorrow.  ?                          Written discharge instructions were provided to the  ?                          patient. ?                          - Resume previous  diet. ?                          - Continue present medications. ?                          - Repeat colonoscopy in 10 years for screening  ?                          purposes. ?                          - Return to GI clinic in 4 months. ?Procedure Code(s):        --- Professional --- ?                          N4627, Colorectal cancer screening; colonoscopy on  ?                          individual not meeting criteria for high risk ?Diagnosis Code(s):        --- Professional --- ?                          Z12.11, Encounter for screening for malignant  ?                          neoplasm of colon ?                          K64.8, Other hemorrhoids ?CPT copyright 2019  American Medical Association. All rights reserved. ?The codes documented in this report are preliminary and upon coder review may  ?be revised to meet current compliance requirements. ?Elon Alas. Abbey Chatters, DO ?Elon Alas. Searles, DO ?04/22/2021 9:19:24 AM ?This report has been signed electronically. ?Number of Addenda: 0 ?

## 2021-04-22 NOTE — Anesthesia Preprocedure Evaluation (Signed)
Anesthesia Evaluation  ?Patient identified by MRN, date of birth, ID band ?Patient awake ? ? ? ?Reviewed: ?Allergy & Precautions, NPO status , Patient's Chart, lab work & pertinent test results ? ?Airway ?Mallampati: II ? ?TM Distance: >3 FB ?Neck ROM: Full ? ? ? Dental ? ?(+) Dental Advisory Given, Poor Dentition, Chipped, Missing, Loose,  ?  ?Pulmonary ?Current SmokerPatient did not abstain from smoking.,  ?  ?Pulmonary exam normal ?breath sounds clear to auscultation ? ? ? ? ? ? Cardiovascular ?hypertension, Pt. on medications ?+ CAD  ?+ Valvular Problems/Murmurs  ?Rhythm:Regular Rate:Normal ?+ Systolic murmurs ? ?  ?Neuro/Psych ?PSYCHIATRIC DISORDERS Anxiety Depression  Neuromuscular disease   ? GI/Hepatic ?GERD  Medicated and Controlled,(+)  ?  ? substance abuse ? alcohol use,   ?Endo/Other  ?diabetes, Well Controlled, Type 2, Oral Hypoglycemic Agents ? Renal/GU ?negative Renal ROS  ?negative genitourinary ?  ?Musculoskeletal ?negative musculoskeletal ROS ?(+)  ? Abdominal ?  ?Peds ?negative pediatric ROS ?(+)  Hematology ?negative hematology ROS ?(+)   ?Anesthesia Other Findings ? ? Reproductive/Obstetrics ?negative OB ROS ? ?  ? ? ? ? ? ? ? ? ? ? ? ? ? ?  ?  ? ? ? ? ? ? ? ? ?Anesthesia Physical ?Anesthesia Plan ? ?ASA: 2 ? ?Anesthesia Plan: General  ? ?Post-op Pain Management: Minimal or no pain anticipated  ? ?Induction: Intravenous ? ?PONV Risk Score and Plan: 1 and Treatment may vary due to age or medical condition and TIVA ? ?Airway Management Planned: Nasal Cannula and Natural Airway ? ?Additional Equipment:  ? ?Intra-op Plan:  ? ?Post-operative Plan:  ? ?Informed Consent: I have reviewed the patients History and Physical, chart, labs and discussed the procedure including the risks, benefits and alternatives for the proposed anesthesia with the patient or authorized representative who has indicated his/her understanding and acceptance.  ? ? ? ?Dental advisory  given ? ?Plan Discussed with: CRNA and Surgeon ? ?Anesthesia Plan Comments:   ? ? ? ? ? ? ?Anesthesia Quick Evaluation ? ?

## 2021-04-22 NOTE — Interval H&P Note (Signed)
History and Physical Interval Note: ? ?04/22/2021 ?7:52 AM ? ?Renee Reilly  has presented today for surgery, with the diagnosis of NAFLD, dysphagia, gerd, colon cancer screening.  The various methods of treatment have been discussed with the patient and family. After consideration of risks, benefits and other options for treatment, the patient has consented to  Procedure(s) with comments: ?COLONOSCOPY WITH PROPOFOL (N/A) - 8:15am, ASA 3 ?ESOPHAGOGASTRODUODENOSCOPY (EGD) WITH PROPOFOL (N/A) ?BALLOON DILATION (N/A) as a surgical intervention.  The patient's history has been reviewed, patient examined, no change in status, stable for surgery.  I have reviewed the patient's chart and labs.  Questions were answered to the patient's satisfaction.   ? ? ?[name] ? ? ?

## 2021-04-22 NOTE — Anesthesia Postprocedure Evaluation (Signed)
Anesthesia Post Note ? ?Patient: Renee Reilly ? ?Procedure(s) Performed: COLONOSCOPY WITH PROPOFOL ?ESOPHAGOGASTRODUODENOSCOPY (EGD) WITH PROPOFOL ?BALLOON DILATION ?BIOPSY ? ?Patient location during evaluation: Phase II ?Anesthesia Type: General ?Level of consciousness: awake and alert and oriented ?Pain management: pain level controlled ?Vital Signs Assessment: post-procedure vital signs reviewed and stable ?Respiratory status: spontaneous breathing, nonlabored ventilation and respiratory function stable ?Cardiovascular status: blood pressure returned to baseline and stable ?Postop Assessment: no apparent nausea or vomiting ?Anesthetic complications: no ? ? ?No notable events documented. ? ? ?Last Vitals:  ?Vitals:  ? 04/22/21 0716 04/22/21 0920  ?BP: 140/80 104/74  ?Pulse:  73  ?Resp: (!) 22 (!) 24  ?Temp: 37 ?C 36.6 ?C  ?SpO2: 97% 98%  ?  ?Last Pain:  ?Vitals:  ? 04/22/21 0920  ?TempSrc: Oral  ?PainSc: 0-No pain  ? ? ?  ?  ?  ?  ?  ?  ? ?Davinia Riccardi C Dashea Mcmullan ? ? ? ? ?

## 2021-04-23 LAB — SURGICAL PATHOLOGY

## 2021-04-27 ENCOUNTER — Encounter (HOSPITAL_COMMUNITY): Payer: Self-pay | Admitting: Internal Medicine

## 2021-04-29 ENCOUNTER — Other Ambulatory Visit: Payer: Self-pay | Admitting: Family Medicine

## 2021-04-29 DIAGNOSIS — M62838 Other muscle spasm: Secondary | ICD-10-CM

## 2021-05-04 ENCOUNTER — Telehealth: Payer: Self-pay

## 2021-05-04 DIAGNOSIS — M5416 Radiculopathy, lumbar region: Secondary | ICD-10-CM

## 2021-05-04 NOTE — Telephone Encounter (Signed)
Patient called to request a call back from Dr. Berline Chough. She stated she wanted to inform her how the Gabapentin was working and also to ask for the results of the x ray. Please advise ?

## 2021-05-05 ENCOUNTER — Telehealth: Payer: Self-pay | Admitting: Physical Medicine and Rehabilitation

## 2021-05-05 MED ORDER — LORAZEPAM 1 MG PO TABS: 1.0000 mg | ORAL_TABLET | Freq: Once | ORAL | 0 refills | Status: AC

## 2021-05-05 NOTE — Telephone Encounter (Signed)
MRI has been approved to be done at [facility].  Left patient a message to call central scheduling @ [phone]. ?

## 2021-05-05 NOTE — Addendum Note (Signed)
Addended by: Genice Rouge on: 05/05/2021 11:07 AM ? ? Modules accepted: Orders ? ?

## 2021-05-05 NOTE — Telephone Encounter (Signed)
Gabapentin knocks the edge off- ?Taking 900 mg QHS ?Hasn't increased to 1200 mg/day.  ? ?No side effects.  ? ?Will get Lumbar MRI-  ? ?Pt asking again for opiates specifically- and said she cannot use Tramadol because "doesn't work".  ? ?Actually perseverated on wanting me to prescribe, and had to explain that I don't have everyone on opiates and we need to figure out if other things work before I prescribe narcotics.  ?

## 2021-05-18 ENCOUNTER — Encounter: Payer: Self-pay | Admitting: Family Medicine

## 2021-05-27 ENCOUNTER — Ambulatory Visit (HOSPITAL_COMMUNITY): Payer: 59 | Attending: Physical Medicine and Rehabilitation

## 2021-06-02 ENCOUNTER — Ambulatory Visit: Payer: 59 | Admitting: Family Medicine

## 2021-06-02 ENCOUNTER — Encounter: Payer: Self-pay | Admitting: Family Medicine

## 2021-06-15 ENCOUNTER — Ambulatory Visit: Payer: 59 | Admitting: Family Medicine

## 2021-06-16 ENCOUNTER — Ambulatory Visit (HOSPITAL_COMMUNITY)
Admission: RE | Admit: 2021-06-16 | Discharge: 2021-06-16 | Disposition: A | Discharge status: Home / Self Care | Payer: 59 | Source: Ambulatory Visit | Attending: Physical Medicine and Rehabilitation | Admitting: Physical Medicine and Rehabilitation

## 2021-06-16 DIAGNOSIS — M5416 Radiculopathy, lumbar region: Secondary | ICD-10-CM | POA: Diagnosis present

## 2021-06-17 ENCOUNTER — Ambulatory Visit: Payer: 59 | Admitting: Family Medicine

## 2021-06-29 ENCOUNTER — Ambulatory Visit: Payer: 59 | Admitting: Family Medicine

## 2021-06-30 ENCOUNTER — Telehealth: Payer: Self-pay | Admitting: Physical Medicine and Rehabilitation

## 2021-06-30 NOTE — Telephone Encounter (Signed)
Patient is inquiring of MRI results  ?

## 2021-07-02 ENCOUNTER — Encounter: Payer: 59 | Admitting: Physical Medicine and Rehabilitation

## 2021-07-09 ENCOUNTER — Ambulatory Visit: Payer: 59 | Admitting: Family Medicine

## 2021-08-28 ENCOUNTER — Encounter: Payer: Self-pay | Admitting: Gastroenterology

## 2021-08-28 NOTE — Progress Notes (Deleted)
Referring Provider: Sonny Masters, FNP Primary Care Physician:  Sonny Masters, FNP Primary GI Physician: Dr. Marletta Lor  No chief complaint on file.   HPI:   Renee Reilly is a 56 y.o. female presenting today for follow-up of GERD, dysphagia, fatty liver s/p EGD and colonoscopy.  Last seen in our office 03/31/2021 at the time of initial consult.  She reported chronic GERD well-controlled on omeprazole 20 mg daily, occasionally taking 2 tablets depending on what she eats.  Noted chronic dysphagia with food getting stuck in substernal region with occasional regurgitation.  Patient had recent CT chest/coronary calcium showing circumferential mucosal thickening of the distal esophagus.  Also hepatic steatosis.  Denied previous or current alcohol use.  Noted mother died of liver cancer.  She is scheduled for EGD, colonoscopy for screening purposes, counseled on fatty liver, planned to get baseline [place] with elastography.   Ultrasound/elastography completed 04/06/2021: With hepatic steatosis, median kPa 3.7, CBD 8 mm likely favoring reservoir effect postcholecystectomy.  Procedures completed 04/22/2021: EGD: Normal esophagus s/p empiric dilation, erythematous mucosa in the gastric body and antrum biopsied, normal examined duodenum.  Recommended starting Prilosec 40 mg daily.  Consider esophageal manometry if symptoms not improved by dilation and increasing PPI.  Colonoscopy: Nonbleeding internal hemorrhoids.  Repeat in 10 years.  Today:  Dysphagia:   GERD:   Fatty liver:  Recent LFTs normal in April.   Past Medical History:  Diagnosis Date   Anxiety    CAD (coronary artery disease), native coronary artery    coronary Ca score of 7.1 and mild CAD  of the RCA with soft plaque < 50% in the mid RCA   Carpal tunnel syndrome    Depression    Hypertension     Past Surgical History:  Procedure Laterality Date   BALLOON DILATION N/A 04/22/2021   Procedure: BALLOON DILATION;  Surgeon: [name], DO;  Location: [facility];  Service: Endoscopy;  Laterality: N/A;   BIOPSY  04/22/2021   Procedure: BIOPSY;  Surgeon: [name], DO;  Location: [facility];  Service: Endoscopy;;   COLONOSCOPY WITH PROPOFOL N/A 04/22/2021   Procedure: COLONOSCOPY WITH PROPOFOL;  Surgeon: [name], DO;  Location: [facility];  Service: Endoscopy;  Laterality: N/A;  8:15am, ASA 3   ESOPHAGOGASTRODUODENOSCOPY (EGD) WITH PROPOFOL N/A 04/22/2021   Procedure: ESOPHAGOGASTRODUODENOSCOPY (EGD) WITH PROPOFOL;  Surgeon: [name], DO;  Location: [facility];  Service: Endoscopy;  Laterality: N/A;   TUBAL LIGATION      Current Outpatient Medications  Medication Sig Dispense Refill   aspirin 81 MG chewable tablet Chew 81 mg by mouth daily.     atorvastatin (LIPITOR) 40 MG tablet Take 1 tablet (40 mg total) by mouth daily. 90 tablet 2   citalopram (CELEXA) 20 MG tablet Take 1 tablet (20 mg total) by mouth daily. 90 tablet 2   cyclobenzaprine (FLEXERIL) 10 MG tablet Take 1 tablet (10 mg total) by mouth 3 (three) times daily as needed for muscle spasms. 90 tablet 5   diphenhydrAMINE (BENADRYL) 25 MG tablet Take 25 mg by mouth every 6 (six) hours as needed for allergies.     DULoxetine (CYMBALTA) 60 MG capsule Take 1 capsule (60 mg total) by mouth daily. 90 capsule 2   fenofibrate (TRICOR) 145 MG tablet Take 1 tablet (145 mg total) by mouth daily. 90 tablet 2   fluticasone (FLONASE) 50 MCG/ACT nasal spray Place 2 sprays into both nostrils  daily as needed for allergies.     gabapentin (NEURONTIN) 300 MG capsule Take 1 capsule (300 mg total) by mouth 2 (two) times daily. X 5 days, then 600 mg 2x/day- x 5 days then 600 mg TID for nerve pain/neuropathy (Patient taking differently: Take 600 mg by mouth 2 (two) times daily.) 180 capsule 5   ibuprofen (ADVIL) 800 MG tablet Take 800 mg by mouth every 8 (eight) hours as needed (pain.).     lisinopril (ZESTRIL) 10 MG tablet Take 1 tablet (10 mg  total) by mouth daily. 90 tablet 2   metFORMIN (GLUCOPHAGE) 1000 MG tablet Take 1 tablet (1,000 mg total) by mouth 2 (two) times daily with a meal. 60 tablet 5   metoprolol tartrate (LOPRESSOR) 100 MG tablet Take 1 tablet (100 mg total) by mouth once for 1 dose. Take 1 tablet two hours prior to CT scan. 1 tablet 0   Multiple Vitamin (MULTIVITAMIN WITH MINERALS) TABS tablet Take 1 tablet by mouth daily.     Omega-3 Fatty Acids (FISH OIL PO) Take 1 capsule by mouth in the morning.     omeprazole (PRILOSEC) 40 MG capsule Take 1 capsule (40 mg total) by mouth daily. 30 capsule 5   traZODone (DESYREL) 100 MG tablet Take 1 tablet (100 mg total) by mouth at bedtime as needed for sleep. (Patient taking differently: Take 150 mg by mouth at bedtime.) 30 tablet 0   No current facility-administered medications for this visit.    Allergies as of 08/30/2021 - Review Complete 04/22/2021  Allergen Reaction Noted   Penicillins Anaphylaxis 02/11/2016    Family History  Problem Relation Age of Onset   Cancer Mother        Liver cancer   Diabetes Brother    Cancer Other     Social History   Socioeconomic History   Marital status: Widowed    Spouse name: Not on file   Number of children: Not on file   Years of education: Not on file   Highest education level: Not on file  Occupational History   Not on file  Tobacco Use   Smoking status: Every Day    Packs/day: 0.25    Types: Cigarettes   Smokeless tobacco: Never  Vaping Use   Vaping Use: Never used  Substance and Sexual Activity   Alcohol use: Yes    Comment: occas   Drug use: No   Sexual activity: Yes    Birth control/protection: Surgical  Other Topics Concern   Not on file  Social History Narrative   Not on file   Social Determinants of Health   Financial Resource Strain: Not on file  Food Insecurity: Not on file  Transportation Needs: Not on file  Physical Activity: Not on file  Stress: Not on file  Social Connections: Not on  file    Review of Systems: Gen: Denies fever, chills, cold or flulike symptoms, presyncope, syncope. CV: Denies chest pain, palpitations. Resp: Denies dyspnea, cough. GI: See HPI Heme: See HPI  Physical Exam: LMP  (LMP Unknown)  General:   Alert and oriented. No distress noted. Pleasant and cooperative.  Head:  Normocephalic and atraumatic. Eyes:  Conjuctiva clear without scleral icterus. Heart:  S1, S2 present without murmurs appreciated. Lungs:  Clear to auscultation bilaterally. No wheezes, rales, or rhonchi. No distress.  Abdomen:  +BS, soft, non-tender and non-distended. No rebound or guarding. No HSM or masses noted. Msk:  Symmetrical without gross deformities. Normal posture. Extremities:  Without  edema. Neurologic:  Alert and  oriented x4 Psych:  Normal mood and affect.    Assessment:     Plan:  ***   Ermalinda Memos, PA-C [facility] 08/30/2021

## 2021-08-30 ENCOUNTER — Encounter: Payer: Self-pay | Admitting: Internal Medicine

## 2021-08-30 ENCOUNTER — Ambulatory Visit: Payer: 59 | Admitting: Gastroenterology

## 2021-09-24 ENCOUNTER — Encounter: Payer: Self-pay | Admitting: Family Medicine

## 2021-09-24 ENCOUNTER — Encounter: Payer: Self-pay | Admitting: Physical Medicine and Rehabilitation

## 2021-09-24 ENCOUNTER — Ambulatory Visit (INDEPENDENT_AMBULATORY_CARE_PROVIDER_SITE_OTHER): Payer: 59 | Admitting: Family Medicine

## 2021-09-24 ENCOUNTER — Encounter: Payer: 59 | Attending: Physical Medicine and Rehabilitation | Admitting: Physical Medicine and Rehabilitation

## 2021-09-24 VITALS — BP 119/78 | HR 86 | Temp 98.7°F | Ht 65.0 in | Wt 204.6 lb

## 2021-09-24 VITALS — BP 143/89 | HR 96 | Ht 65.0 in | Wt 204.6 lb

## 2021-09-24 DIAGNOSIS — G8929 Other chronic pain: Secondary | ICD-10-CM | POA: Diagnosis present

## 2021-09-24 DIAGNOSIS — E1159 Type 2 diabetes mellitus with other circulatory complications: Secondary | ICD-10-CM | POA: Diagnosis not present

## 2021-09-24 DIAGNOSIS — M5442 Lumbago with sciatica, left side: Secondary | ICD-10-CM | POA: Insufficient documentation

## 2021-09-24 DIAGNOSIS — M5441 Lumbago with sciatica, right side: Secondary | ICD-10-CM | POA: Insufficient documentation

## 2021-09-24 DIAGNOSIS — E1169 Type 2 diabetes mellitus with other specified complication: Secondary | ICD-10-CM | POA: Diagnosis not present

## 2021-09-24 DIAGNOSIS — E1141 Type 2 diabetes mellitus with diabetic mononeuropathy: Secondary | ICD-10-CM | POA: Insufficient documentation

## 2021-09-24 DIAGNOSIS — E1142 Type 2 diabetes mellitus with diabetic polyneuropathy: Secondary | ICD-10-CM | POA: Diagnosis present

## 2021-09-24 DIAGNOSIS — G894 Chronic pain syndrome: Secondary | ICD-10-CM | POA: Diagnosis present

## 2021-09-24 DIAGNOSIS — M47817 Spondylosis without myelopathy or radiculopathy, lumbosacral region: Secondary | ICD-10-CM | POA: Insufficient documentation

## 2021-09-24 DIAGNOSIS — E785 Hyperlipidemia, unspecified: Secondary | ICD-10-CM

## 2021-09-24 DIAGNOSIS — Z5181 Encounter for therapeutic drug level monitoring: Secondary | ICD-10-CM | POA: Insufficient documentation

## 2021-09-24 DIAGNOSIS — Z79891 Long term (current) use of opiate analgesic: Secondary | ICD-10-CM | POA: Insufficient documentation

## 2021-09-24 DIAGNOSIS — I152 Hypertension secondary to endocrine disorders: Secondary | ICD-10-CM

## 2021-09-24 LAB — BAYER DCA HB A1C WAIVED: HB A1C (BAYER DCA - WAIVED): 8 % — ABNORMAL HIGH (ref 4.8–5.6)

## 2021-09-24 MED ORDER — ATORVASTATIN CALCIUM 40 MG PO TABS: 40.0000 mg | ORAL_TABLET | Freq: Every day | ORAL | 3 refills | Status: DC

## 2021-09-24 MED ORDER — TRAMADOL HCL 50 MG PO TABS
50.0000 mg | ORAL_TABLET | Freq: Three times a day (TID) | ORAL | 0 refills | Status: DC | PRN
Start: 2021-09-24 — End: 2021-10-01

## 2021-09-24 MED ORDER — GABAPENTIN 300 MG PO CAPS: 900.0000 mg | ORAL_CAPSULE | Freq: Three times a day (TID) | ORAL | 5 refills | Status: DC

## 2021-09-24 NOTE — Patient Instructions (Addendum)

## 2021-09-24 NOTE — Progress Notes (Signed)
Subjective:  Patient ID: Renee Reilly, female    DOB: [birth year], 56 y.o.   MRN: [id number]  Patient Care Team: Baruch Gouty, FNP as PCP - General (Family Medicine)   Chief Complaint:  Medical Management of Chronic Issues   HPI: Renee Reilly is a 56 y.o. female presenting on 09/24/2021 for Medical Management of Chronic Issues    1. Hypertension associated with diabetes ([place]) Taking medications as prescribed without associated side effects. No chest pain, leg swelling, headaches, visual changes, weakness, dizziness, or syncope.   2. Type 2 diabetes mellitus with other specified complication, without long-term current use of insulin (HCC) Has been taking metformin 1000 mg twice daily without associated side effects. She is on ACEi, statin, and ASA therapy. She does not follow a diet or exercise routine.   3. Hyperlipidemia associated with type 2 diabetes mellitus ([place]) On statin therapy and tolerating well. Denies worsening myalgias. She does not follow a strict diet or exercise routine.   4. Diabetic mononeuropathy associated with type 2 diabetes mellitus ([place]) 5. Chronic bilateral low back pain with bilateral sciatica Was seen by pain management today and a treatment plan was initiated. Pt has not started tramadol or increased gabapentin dosing as she was just seen today.      Relevant past medical, surgical, family, and social history reviewed and updated as indicated.  Allergies and medications reviewed and updated. Data reviewed: Chart in Epic.   Past Medical History:  Diagnosis Date   Anxiety    CAD (coronary artery disease), native coronary artery    coronary Ca score of 7.1 and mild CAD  of the RCA with soft plaque < 50% in the mid RCA   Carpal tunnel syndrome    Depression    Hypertension     Past Surgical History:  Procedure Laterality Date   BALLOON DILATION N/A 04/22/2021   Procedure: BALLOON DILATION;  Surgeon: Eloise Harman, DO;  [facility];  Service: Endoscopy;  Laterality: N/A;   BIOPSY  04/22/2021   Procedure: BIOPSY;  Surgeon: Eloise Harman, DO;  [facility];  Service: Endoscopy;;   COLONOSCOPY WITH PROPOFOL N/A 04/22/2021   Surgeon: Eloise Harman, DO; nonbleeding internal hemorrhoids.  Repeat in 10 years.   ESOPHAGOGASTRODUODENOSCOPY (EGD) WITH PROPOFOL N/A 04/22/2021   Surgeon: Eloise Harman, DO;  Normal esophagus s/p empiric dilation, erythematous mucosa in the gastric body and antrum biopsied, normal examined duodenum.   TUBAL LIGATION      Social History   Socioeconomic History   Marital status: Widowed    Spouse name: Not on file   Number of children: Not on file   Years of education: Not on file   Highest education level: Not on file  Occupational History   Not on file  Tobacco Use   Smoking status: Every Day    Packs/day: 0.25    Types: Cigarettes   Smokeless tobacco: Never  Vaping Use   Vaping Use: Never used  Substance and Sexual Activity   Alcohol use: Yes    Comment: occas   Drug use: No   Sexual activity: Yes    Birth control/protection: Surgical  Other Topics Concern   Not on file  Social History Narrative   Not on file   Social Determinants of Health   Financial Resource Strain: Not on file  Food Insecurity: Not on file  Transportation Needs: Not on file  Physical Activity: Not on file  Stress: Not on file  Social Connections: Not on file  Intimate Partner Violence: Not on file    Outpatient Encounter Medications as of 09/24/2021  Medication Sig   aspirin 81 MG chewable tablet Chew 81 mg by mouth daily.   citalopram (CELEXA) 20 MG tablet Take 1 tablet (20 mg total) by mouth daily.   cyclobenzaprine (FLEXERIL) 10 MG tablet Take 1 tablet (10 mg total) by mouth 3 (three) times daily as needed for muscle spasms.   DULoxetine (CYMBALTA) 60 MG capsule Take 1 capsule (60 mg total) by mouth daily.   fenofibrate (TRICOR) 145 MG tablet Take 1 tablet (145 mg  total) by mouth daily.   fluticasone (FLONASE) 50 MCG/ACT nasal spray Place 2 sprays into both nostrils daily as needed for allergies.   gabapentin (NEURONTIN) 300 MG capsule Take 3 capsules (900 mg total) by mouth 3 (three) times daily. for nerve pain/neuropathy   lisinopril (ZESTRIL) 10 MG tablet Take 1 tablet (10 mg total) by mouth daily.   metFORMIN (GLUCOPHAGE) 1000 MG tablet Take 1 tablet (1,000 mg total) by mouth 2 (two) times daily with a meal.   Multiple Vitamin (MULTIVITAMIN WITH MINERALS) TABS tablet Take 1 tablet by mouth daily.   Omega-3 Fatty Acids (FISH OIL PO) Take 1 capsule by mouth in the morning.   omeprazole (PRILOSEC) 40 MG capsule Take 1 capsule (40 mg total) by mouth daily.   traMADol (ULTRAM) 50 MG tablet Take 1 tablet (50 mg total) by mouth 3 (three) times daily as needed.   traZODone (DESYREL) 100 MG tablet Take 1 tablet (100 mg total) by mouth at bedtime as needed for sleep. (Patient taking differently: Take 150 mg by mouth at bedtime.)   [DISCONTINUED] atorvastatin (LIPITOR) 40 MG tablet Take 1 tablet (40 mg total) by mouth daily.   [DISCONTINUED] diphenhydrAMINE (BENADRYL) 25 MG tablet Take 25 mg by mouth every 6 (six) hours as needed for allergies.   [DISCONTINUED] ibuprofen (ADVIL) 800 MG tablet Take 800 mg by mouth every 8 (eight) hours as needed (pain.).   atorvastatin (LIPITOR) 40 MG tablet Take 1 tablet (40 mg total) by mouth daily.   [DISCONTINUED] metoprolol tartrate (LOPRESSOR) 100 MG tablet Take 1 tablet (100 mg total) by mouth once for 1 dose. Take 1 tablet two hours prior to CT scan.   No facility-administered encounter medications on file as of 09/24/2021.    Allergies  Allergen Reactions   Penicillins Anaphylaxis    Review of Systems  Constitutional:  Negative for activity change, appetite change, chills, diaphoresis, fatigue, fever and unexpected weight change.  HENT: Negative.    Eyes: Negative.  Negative for photophobia and visual disturbance.   Respiratory:  Negative for cough, chest tightness and shortness of breath.   Cardiovascular:  Negative for chest pain, palpitations and leg swelling.  Gastrointestinal:  Negative for abdominal pain, blood in stool, constipation, diarrhea, nausea and vomiting.  Endocrine: Negative.  Negative for polydipsia, polyphagia and polyuria.  Genitourinary:  Negative for decreased urine volume, difficulty urinating, dysuria, frequency and urgency.  Musculoskeletal:  Positive for arthralgias, back pain and myalgias. Negative for gait problem, joint swelling, neck pain and neck stiffness.  Skin: Negative.   Allergic/Immunologic: Negative.   Neurological:  Positive for numbness (feet at times). Negative for dizziness, tremors, seizures, syncope, facial asymmetry, speech difficulty, weakness, light-headedness and headaches.  Hematological: Negative.   Psychiatric/Behavioral:  Positive for sleep disturbance. Negative for agitation, behavioral problems, confusion, decreased concentration, dysphoric mood, hallucinations, self-injury and suicidal ideas. The  patient is not nervous/anxious and is not hyperactive.   All other systems reviewed and are negative.       Objective:  BP 119/78   Pulse 86   Temp 98.7 F (37.1 C)   Ht _0  (1.651 m)   Wt 204 lb 9.6 oz (92.8 kg)   LMP  (LMP Unknown)   SpO2 96%   BMI 34.05 kg/m    Wt Readings from Last 3 Encounters:  09/24/21 204 lb 9.6 oz (92.8 kg)  09/24/21 204 lb 9.6 oz (92.8 kg)  04/20/21 205 lb 6.4 oz (93.2 kg)    Physical Exam Vitals and nursing note reviewed.  Constitutional:      General: She is not in acute distress.    Appearance: Normal appearance. She is well-developed and well-groomed. She is obese. She is not ill-appearing, toxic-appearing or diaphoretic.  HENT:     Head: Normocephalic and atraumatic.     Jaw: There is normal jaw occlusion.     Right Ear: Hearing normal.     Left Ear: Hearing normal.     Nose: Nose normal.      Mouth/Throat:     Lips: Pink.     Mouth: Mucous membranes are moist.     Pharynx: Uvula midline.  Eyes:     General: Lids are normal.     Pupils: Pupils are equal, round, and reactive to light.  Neck:     Thyroid: No thyroid mass, thyromegaly or thyroid tenderness.     Vascular: No carotid bruit or JVD.     Trachea: Trachea and phonation normal.  Cardiovascular:     Rate and Rhythm: Normal rate and regular rhythm.     Chest Wall: PMI is not displaced.     Pulses: Normal pulses.     Heart sounds: Murmur heard.     Systolic murmur is present with a grade of 3/6.     No friction rub. No gallop.  Pulmonary:     Effort: Pulmonary effort is normal. No respiratory distress.     Breath sounds: Normal breath sounds. No wheezing.  Abdominal:     General: Bowel sounds are normal. There is no abdominal bruit.     Palpations: Abdomen is soft. There is no hepatomegaly or splenomegaly.  Musculoskeletal:     Cervical back: Normal, normal range of motion and neck supple.     Thoracic back: Decreased range of motion.     Lumbar back: Decreased range of motion.     Right hip: Normal.     Left hip: Normal.     Right lower leg: No edema.     Left lower leg: No edema.  Lymphadenopathy:     Cervical: No cervical adenopathy.  Skin:    General: Skin is warm and dry.     Capillary Refill: Capillary refill takes less than 2 seconds.     Coloration: Skin is not cyanotic, jaundiced or pale.     Findings: No rash.  Neurological:     General: No focal deficit present.     Mental Status: She is alert and oriented to person, place, and time.     Sensory: Sensation is intact.     Motor: Motor function is intact.     Coordination: Coordination is intact.     Gait: Gait is intact.     Deep Tendon Reflexes: Reflexes are normal and symmetric.  Psychiatric:        Attention and Perception: Attention and perception normal.  Mood and Affect: Mood and affect normal.        Speech: Speech normal.         Behavior: Behavior normal. Behavior is cooperative.        Thought Content: Thought content normal.        Cognition and Memory: Cognition and memory normal.        Judgment: Judgment normal.     Results for orders placed or performed during the hospital encounter of 04/22/21  Glucose, capillary  Result Value Ref Range   Glucose-Capillary 137 (H) 70 - 99 mg/dL  Surgical pathology  Result Value Ref Range   SURGICAL PATHOLOGY      SURGICAL PATHOLOGY CASE: [id number] PATIENT: Deloria Sarver Surgical Pathology Report     Clinical History: NAFLD, dysplasia, GERD, colon cancer screening     FINAL MICROSCOPIC DIAGNOSIS:  A. STOMACH, BIOPSY: Reactive gastropathy Negative for H. pylori, intestinal metaplasia, dysplasia and carcinoma  GROSS DESCRIPTION:  Received in formalin are tan, soft tissue fragments that are submitted in toto. Number: 2 size: 0.6 and 0.9 cm blocks: 1 (GRP 04/22/2021)   Final Diagnosis performed by Tobin Chad, MD.   Electronically signed 04/23/2021 Technical component performed at [facility], [place] [address] [place], [place] [address].  Professional component performed at [employer]. [facility], [place] [address] [place], [place] [address].  Immunohistochemistry Technical component (if applicable) was performed at [facility]. [address] [place], [place], [place] [address].   IMMUNOHISTOCHEMISTRY DISCLAIMER (if ap plicable): Some of these immunohistochemical stains may have been developed and the performance characteristics determine by [facility]. Some may not have been cleared or approved by the U.S. Food and Drug Administration. The FDA has determined that such clearance or approval is not necessary. This test is used for clinical purposes. It should not be regarded as investigational or for research. This laboratory is certified under the [place] (CLIA-88) as qualified to perform high complexity clinical laboratory testing.  The controls stained appropriately.        Pertinent labs & imaging results that were available during my care of the patient were reviewed by me and considered in my medical decision making.  Assessment & Plan:  Willeen was seen today for medical management of chronic issues.  Diagnoses and all orders for this visit:  Type 2 diabetes mellitus with other specified complication, without long-term current use of insulin ([place]) A1C 8.0 in office today. States her diet has not been great. Does not wish to add medications today. Aware to make lifestyle changes over the next 3 months, if A1C remains above goal, will adjust regimen at next visit. Other labs pending.  -     CBC with Differential/Platelet -     CMP14+EGFR -     Thyroid Panel With TSH -     Lipid panel -     Microalbumin / creatinine urine ratio  Hypertension associated with diabetes (HCC) BP well controlled. Changes were not made in regimen today. Goal BP is 130/80. Pt aware to report any persistent high or low readings. DASH diet and exercise encouraged. Exercise at least 150 minutes per week and increase as tolerated. Goal BMI > 25. Stress management encouraged. Avoid nicotine and tobacco product use. Avoid excessive alcohol and NSAID's. Avoid more than 2000 mg of sodium daily. Medications as prescribed. Follow up as scheduled.  -     Bayer DCA Hb A1c Waived -  CBC with Differential/Platelet -     CMP14+EGFR -     Thyroid Panel With TSH -     Lipid panel -     Microalbumin / creatinine urine ratio  Hyperlipidemia associated with type 2 diabetes mellitus ([place]) Diet encouraged - increase intake of fresh fruits and vegetables, increase intake of lean proteins. Bake, broil, or grill foods. Avoid fried, greasy, and fatty foods. Avoid fast foods. Increase intake of fiber-rich whole grains. Exercise encouraged - at least 150 minutes per week  and advance as tolerated. Goal BMI < 25. Continue medications as prescribed. Follow up in 3-6 months as discussed.  -     atorvastatin (LIPITOR) 40 MG tablet; Take 1 tablet (40 mg total) by mouth daily.  Diabetic mononeuropathy associated with type 2 diabetes mellitus ([place]) Chronic bilateral low back pain with bilateral sciatica Seen by pain management today and treatment regimen was adjusted. Pt has follow up scheduled with them for potential injections.     Continue all other maintenance medications.  Follow up plan: Return in about 3 months (around 12/25/2021) for DM.   Continue healthy lifestyle choices, including diet (rich in fruits, vegetables, and lean proteins, and low in salt and simple carbohydrates) and exercise (at least 30 minutes of moderate physical activity daily).  Educational handout given for DM  The above assessment and management plan was discussed with the patient. The patient verbalized understanding of and has agreed to the management plan. Patient is aware to call the clinic if they develop any new symptoms or if symptoms persist or worsen. Patient is aware when to return to the clinic for a follow-up visit. Patient educated on when it is appropriate to go to the emergency department.   Monia Pouch, FNP-C [facility] [phone]

## 2021-09-24 NOTE — Progress Notes (Signed)
Subjective:    Patient ID: Renee Reilly, female    DOB: [birth year], 56 y.o.   MRN: [id number]  HPI  Pt is a 56 yr old female with hx of DM2 with A1c of 7.3 since 2019 and depression, and B/L carpal tunnel syndrome as well as diabetic neuropathy in feet-  And chronic back pain - here for f/u on chronic pain.    Up to 7 gabapentin/day.  Takes 4 gabapentin in AM and (7am)-  then take 3 tabs around 2pm.   Takes 8 hour arthritis tylenol- helps some.   Gabapentin makes her sleepy/tired.  Pain still- 9-10/10 all the time.  :"it just hurts".   Also gets bad cramps in legs.  Flexeril helps the cramps- mainly takes at night so can rest- helps some-   Pain gets down to 8/10 when takes flexeril/gabapentin.   LLE hurts more than RLE, but R back hurts more than L back.    Said hurts "all over". And wants opiates for pain.  [place] Spaniel "nothing else works". Explained again how her description of pain is neuropathic and arthritic-   Willing now to try Tramadol- when said before wasn't going to because it didn't work.     Sees NP today at 3:30pm  MRI lumbar spine 06/16/21.  Conus medullaris and cauda equina: Conus extends to the T12-L1 level. Conus and cauda equina appear normal.   Paraspinal and other soft tissues: Unremarkable.   Disc levels: Imaged in the sagittal plane only, there is a small central protrusion at T11-T12.   L1-L2:  No canal or foraminal stenosis.   L2-L3:  Minimal disc bulge.  No canal or foraminal stenosis.   L3-L4:  Minimal disc bulge.  No canal or foraminal stenosis.   L4-L5: Disc bulge slightly eccentric to the left with endplate osteophytic ridging. Moderate facet arthropathy with ligamentum flavum infolding. Mild canal stenosis with slight effacement of subarticular recesses. No right foraminal stenosis. Mild left foraminal stenosis.   L5-S1: Disc bulge with endplate osteophytic ridging and superimposed shallow central protrusion. Moderate right and  mild left facet arthropathy. No canal stenosis. Slight effacement of the right subarticular recess. Mild right and minor left foraminal stenosis.   IMPRESSION: Primarily lower lumbar degenerative changes as detailed above without high-grade stenosis.      Pain Inventory Average Pain 10 Pain Right Now 10 My pain is sharp, burning, stabbing, tingling, and aching  In the last 24 hours, has pain interfered with the following? General activity 9 Relation with others 8 Enjoyment of life 10 What TIME of day is your pain at its worst? morning  and evening Sleep (in general) Fair  Pain is worse with: walking, bending, sitting, inactivity, and standing Pain improves with: rest, pacing activities, and medication Relief from Meds: 3  Family History  Problem Relation Age of Onset   Cancer Mother        Liver cancer   Diabetes Brother    Cancer Other    Social History   Socioeconomic History   Marital status: Widowed    Spouse name: Not on file   Number of children: Not on file   Years of education: Not on file   Highest education level: Not on file  Occupational History   Not on file  Tobacco Use   Smoking status: Every Day    Packs/day: 0.25    Types: Cigarettes   Smokeless tobacco: Never  Vaping Use   Vaping Use: Never used  Substance and  Sexual Activity   Alcohol use: Yes    Comment: occas   Drug use: No   Sexual activity: Yes    Birth control/protection: Surgical  Other Topics Concern   Not on file  Social History Narrative   Not on file   Social Determinants of Health   Financial Resource Strain: Not on file  Food Insecurity: Not on file  Transportation Needs: Not on file  Physical Activity: Not on file  Stress: Not on file  Social Connections: Not on file   Past Surgical History:  Procedure Laterality Date   BALLOON DILATION N/A 04/22/2021   Procedure: BALLOON DILATION;  Surgeon: [name], DO;  [facility];  Service: Endoscopy;   Laterality: N/A;   BIOPSY  04/22/2021   Procedure: BIOPSY;  Surgeon: [name], DO;  [facility];  Service: Endoscopy;;   COLONOSCOPY WITH PROPOFOL N/A 04/22/2021   Surgeon: [name], DO; nonbleeding internal hemorrhoids.  Repeat in 10 years.   ESOPHAGOGASTRODUODENOSCOPY (EGD) WITH PROPOFOL N/A 04/22/2021   Surgeon: [name], DO;  Normal esophagus s/p empiric dilation, erythematous mucosa in the gastric body and antrum biopsied, normal examined duodenum.   TUBAL LIGATION     Past Surgical History:  Procedure Laterality Date   BALLOON DILATION N/A 04/22/2021   Procedure: BALLOON DILATION;  Surgeon: [name], DO;  [facility];  Service: Endoscopy;  Laterality: N/A;   BIOPSY  04/22/2021   Procedure: BIOPSY;  Surgeon: [name], DO;  [facility];  Service: Endoscopy;;   COLONOSCOPY WITH PROPOFOL N/A 04/22/2021   Surgeon: [name], DO; nonbleeding internal hemorrhoids.  Repeat in 10 years.   ESOPHAGOGASTRODUODENOSCOPY (EGD) WITH PROPOFOL N/A 04/22/2021   Surgeon: [name], DO;  Normal esophagus s/p empiric dilation, erythematous mucosa in the gastric body and antrum biopsied, normal examined duodenum.   TUBAL LIGATION     Past Medical History:  Diagnosis Date   Anxiety    CAD (coronary artery disease), native coronary artery    coronary Ca score of 7.1 and mild CAD  of the RCA with soft plaque < 50% in the mid RCA   Carpal tunnel syndrome    Depression    Hypertension    BP (!) 143/89   Pulse 96   Ht 5\' 5"  (1.651 m)   Wt 204 lb 9.6 oz (92.8 kg)   LMP  (LMP Unknown)   SpO2 98%   BMI 34.05 kg/m   Opioid Risk Score:   Fall Risk Score:  `1  Depression screen [facility] 2/9     09/24/2021   10:26 AM 03/26/2021   10:38 AM 03/02/2021   10:39 AM 11/27/2020   11:40 AM  Depression screen PHQ 2/9  Decreased Interest 0 2 1 2   Down, Depressed, Hopeless 3 2 1 3   PHQ - 2 Score 3 4 2 5   Altered  sleeping  2 2 3   Tired, decreased energy  3 2 3   Change in appetite  2 1 2   Feeling bad or failure about yourself   2 1 2   Trouble concentrating  3 2 2   Moving slowly or fidgety/restless  1 1 3   Suicidal thoughts  0 0 0  PHQ-9 Score  17 11 20   Difficult doing work/chores   Very difficult Very difficult     Review of Systems  Constitutional: Negative.   HENT: Negative.    Eyes: Negative.   Respiratory: Negative.  Cardiovascular: Negative.   Gastrointestinal: Negative.   Endocrine: Negative.   Genitourinary: Negative.   Musculoskeletal:  Positive for back pain.  Skin: Negative.   Allergic/Immunologic: Negative.   Neurological: Negative.   Hematological: Negative.   Psychiatric/Behavioral:  Positive for dysphoric mood.       Objective:   Physical Exam  Awake, alert, appropriate, sitting on table, NAD TTP over midline lower L4-S1 spine as well as R paraspinals- no trigger points palpated.   C/o pain down LLE >RLE     Assessment & Plan:   Pt is a 56 yr old female with hx of DM2 with A1c of 7.3 since 2019 and depression, and B/L carpal tunnel syndrome as well as diabetic neuropathy in feet-  And chronic back pain here for evaluation of pain issues.  Here for f/u on chronic pain.    Will see if Dr Wynn Banker can see for Facet injection L4/5 and/or L5/S1.  2. Will increase gabapentin to 900 mg 3x/day- for nerve pain-    3. Going to trial next month for disability- said would get letter regarding disability.   4. Opiate contract- and UDS today-   5. Will give Tramadol 50 mg 3x/day as needed for pain #21- 1 week's supply- can give 30 days after UDS comes back.    6. F/U in 3 months on chronic pain; and ESI with Dr Wynn Banker for facet arthropathy   I spent a total of  22  minutes on total care today- >50% coordination of care- due to discussing facet arthropathy/possible injections and chronic pain

## 2021-09-24 NOTE — Patient Instructions (Signed)
Pt is a 56 yr old female with hx of DM2 with A1c of 7.3 since 2019 and depression, and B/L carpal tunnel syndrome as well as diabetic neuropathy in feet-  And chronic back pain here for evaluation of pain issues.  Here for f/u on chronic pain.    Will see if Dr Wynn Banker can see for Facet injection L4/5 and/or L5/S1.  2. Will increase gabapentin to 900 mg 3x/day- for nerve pain-    3. Going to trial next month for disability- said would get letter regarding disability.   4. Opiate contract- and UDS today-   5. Will give Tramadol 50 mg 3x/day as needed for pain #21- 1 week's supply- can give 30 days after UDS comes back.    6. F/U in 3 months on chronic pain; and ESI with Dr Wynn Banker for facet arthropathy

## 2021-09-25 LAB — CMP14+EGFR
ALT: 24 IU/L (ref 0–32)
AST: 22 IU/L (ref 0–40)
Albumin/Globulin Ratio: 1.6 (ref 1.2–2.2)
Albumin: 4.5 g/dL (ref 3.8–4.9)
Alkaline Phosphatase: 69 IU/L (ref 44–121)
BUN/Creatinine Ratio: 14 (ref 9–23)
BUN: 11 mg/dL (ref 6–24)
Bilirubin Total: 0.3 mg/dL (ref 0.0–1.2)
CO2: 21 mmol/L (ref 20–29)
Calcium: 9.8 mg/dL (ref 8.7–10.2)
Chloride: 97 mmol/L (ref 96–106)
Creatinine, Ser: 0.78 mg/dL (ref 0.57–1.00)
Globulin, Total: 2.9 g/dL (ref 1.5–4.5)
Glucose: 249 mg/dL — ABNORMAL HIGH (ref 70–99)
Potassium: 4.4 mmol/L (ref 3.5–5.2)
Sodium: 137 mmol/L (ref 134–144)
Total Protein: 7.4 g/dL (ref 6.0–8.5)
eGFR: 90 mL/min/{1.73_m2} (ref >59)

## 2021-09-25 LAB — CBC WITH DIFFERENTIAL/PLATELET
Basophils Absolute: 0.1 10*3/uL (ref 0.0–0.2)
Basos: 1 %
EOS (ABSOLUTE): 0.2 10*3/uL (ref 0.0–0.4)
Eos: 2 %
Hematocrit: 42.4 % (ref 34.0–46.6)
Hemoglobin: 14.3 g/dL (ref 11.1–15.9)
Immature Grans (Abs): 0 10*3/uL (ref 0.0–0.1)
Immature Granulocytes: 0 %
Lymphocytes Absolute: 3.5 10*3/uL — ABNORMAL HIGH (ref 0.7–3.1)
Lymphs: 43 %
MCH: 32.1 pg (ref 26.6–33.0)
MCHC: 33.7 g/dL (ref 31.5–35.7)
MCV: 95 fL (ref 79–97)
Monocytes Absolute: 0.4 10*3/uL (ref 0.1–0.9)
Monocytes: 5 %
Neutrophils Absolute: 4 10*3/uL (ref 1.4–7.0)
Neutrophils: 49 %
Platelets: 259 10*3/uL (ref 150–450)
RBC: 4.46 x10E6/uL (ref 3.77–5.28)
RDW: 12.6 % (ref 11.7–15.4)
WBC: 8.1 10*3/uL (ref 3.4–10.8)

## 2021-09-25 LAB — MICROALBUMIN / CREATININE URINE RATIO
Creatinine, Urine: 56.5 mg/dL
Microalb/Creat Ratio: <5 mg/g creat (ref 0–29)
Microalbumin, Urine: <3 ug/mL

## 2021-09-25 LAB — LIPID PANEL
Chol/HDL Ratio: 3.8 ratio (ref 0.0–4.4)
Cholesterol, Total: 139 mg/dL (ref 100–199)
HDL: 37 mg/dL — ABNORMAL LOW (ref >39)
LDL Chol Calc (NIH): 38 mg/dL (ref 0–99)
Triglycerides: 447 mg/dL — ABNORMAL HIGH (ref 0–149)
VLDL Cholesterol Cal: 64 mg/dL — ABNORMAL HIGH (ref 5–40)

## 2021-09-25 LAB — THYROID PANEL WITH TSH
Free Thyroxine Index: 1.7 (ref 1.2–4.9)
T3 Uptake Ratio: 22 % — ABNORMAL LOW (ref 24–39)
T4, Total: 7.9 ug/dL (ref 4.5–12.0)
TSH: 1.22 u[IU]/mL (ref 0.450–4.500)

## 2021-09-30 ENCOUNTER — Telehealth: Payer: Self-pay

## 2021-09-30 LAB — TOXASSURE SELECT,+ANTIDEPR,UR

## 2021-09-30 NOTE — Telephone Encounter (Signed)
Renee Reilly called to check if you will be prescribing pain medication for her?   Call back phone [phone].

## 2021-10-01 MED ORDER — TRAMADOL HCL 50 MG PO TABS: 50.0000 mg | ORAL_TABLET | Freq: Three times a day (TID) | ORAL | 1 refills | Status: DC | PRN

## 2021-10-01 NOTE — Addendum Note (Signed)
Addended by: Genice Rouge on: 10/01/2021 10:09 AM   Modules accepted: Orders

## 2021-10-03 ENCOUNTER — Other Ambulatory Visit: Payer: Self-pay | Admitting: Physical Medicine and Rehabilitation

## 2021-10-03 DIAGNOSIS — M62838 Other muscle spasm: Secondary | ICD-10-CM

## 2021-10-04 ENCOUNTER — Telehealth: Payer: Self-pay | Admitting: *Deleted

## 2021-10-04 NOTE — Telephone Encounter (Signed)
Urine drug screen for this encounter is consistent for prescribed medication. No controlled medication is present.

## 2021-10-15 ENCOUNTER — Other Ambulatory Visit: Payer: Self-pay | Admitting: Family Medicine

## 2021-10-15 DIAGNOSIS — E1169 Type 2 diabetes mellitus with other specified complication: Secondary | ICD-10-CM

## 2021-11-09 ENCOUNTER — Telehealth: Payer: Self-pay

## 2021-11-09 NOTE — Telephone Encounter (Signed)
PA been submitted for Tramadol

## 2021-11-10 ENCOUNTER — Telehealth: Payer: Self-pay

## 2021-11-10 NOTE — Telephone Encounter (Signed)
Submitted PA for Facet injection for patient and insurance is pending medical director review. Can a peer to peer be done for this patient?

## 2021-11-11 ENCOUNTER — Telehealth: Payer: Self-pay

## 2021-11-11 NOTE — Telephone Encounter (Signed)
Called Patient to advise Renee Reilly has been denied due to not being a covered service per her insurance, facet injections and medial branch blocks using a steroid to treat pain are not supported by research and are not covered, cancelled appt for patient, and also informed PA for tramadol was approved. Advised she may want to consider changing insurance if she wishes to get steroid injections in the future for pain relief

## 2021-11-15 NOTE — Telephone Encounter (Signed)
Approved 11/09/21-05/10/22

## 2021-11-16 ENCOUNTER — Encounter: Payer: 59 | Admitting: Physical Medicine & Rehabilitation

## 2021-11-17 ENCOUNTER — Ambulatory Visit: Payer: 59 | Admitting: Family Medicine

## 2021-11-18 ENCOUNTER — Encounter: Payer: Self-pay | Admitting: Family Medicine

## 2021-11-18 ENCOUNTER — Ambulatory Visit: Payer: 59 | Admitting: Gastroenterology

## 2021-11-18 ENCOUNTER — Telehealth: Payer: Self-pay

## 2021-11-18 NOTE — Telephone Encounter (Signed)
Gabapentin approved.

## 2021-11-19 ENCOUNTER — Other Ambulatory Visit: Payer: Self-pay | Admitting: Physical Medicine and Rehabilitation

## 2021-11-19 DIAGNOSIS — M62838 Other muscle spasm: Secondary | ICD-10-CM

## 2021-11-19 NOTE — Telephone Encounter (Signed)
Approved 11/18/21-11/18/22

## 2021-11-26 ENCOUNTER — Ambulatory Visit (INDEPENDENT_AMBULATORY_CARE_PROVIDER_SITE_OTHER): Payer: 59 | Admitting: Family Medicine

## 2021-11-26 ENCOUNTER — Encounter: Payer: Self-pay | Admitting: Family Medicine

## 2021-11-26 VITALS — BP 121/80 | HR 86 | Temp 98.5°F | Ht 65.0 in | Wt 204.0 lb

## 2021-11-26 DIAGNOSIS — Z9189 Other specified personal risk factors, not elsewhere classified: Secondary | ICD-10-CM | POA: Diagnosis not present

## 2021-11-26 NOTE — Progress Notes (Signed)
Subjective:  Patient ID: Renee Reilly, female    DOB: [birth year], 56 y.o.   MRN: [id number]  Patient Care Team: Baruch Gouty, FNP as PCP - General (Family Medicine)   Chief Complaint:  No chief complaint on file.   HPI: Renee Reilly is a 56 y.o. female presenting on 11/26/2021 for No chief complaint on file.   Pt presents today to have forms completed for DMV. She received a DUI and is supposed to have a device placed in her vehicle for breathalyzer testing. She states she does not feel she can do the testing and would like a form signed stating she can't. No prior history of pulmonary disease. No prior pulmonary function testing.       Relevant past medical, surgical, family, and social history reviewed and updated as indicated.  Allergies and medications reviewed and updated. Data reviewed: Chart in Epic.   Past Medical History:  Diagnosis Date   Anxiety    CAD (coronary artery disease), native coronary artery    coronary Ca score of 7.1 and mild CAD  of the RCA with soft plaque < 50% in the mid RCA   Carpal tunnel syndrome    Depression    Hypertension     Past Surgical History:  Procedure Laterality Date   BALLOON DILATION N/A 04/22/2021   Procedure: BALLOON DILATION;  Surgeon: Eloise Harman, DO;  [facility];  Service: Endoscopy;  Laterality: N/A;   BIOPSY  04/22/2021   Procedure: BIOPSY;  Surgeon: Eloise Harman, DO;  [facility]: [facility];  Service: Endoscopy;;   COLONOSCOPY WITH PROPOFOL N/A 04/22/2021   Surgeon: Eloise Harman, DO; nonbleeding internal hemorrhoids.  Repeat in 10 years.   ESOPHAGOGASTRODUODENOSCOPY (EGD) WITH PROPOFOL N/A 04/22/2021   Surgeon: Eloise Harman, DO;  Normal esophagus s/p empiric dilation, erythematous mucosa in the gastric body and antrum biopsied, normal examined duodenum.   TUBAL LIGATION      Social History   Socioeconomic History   Marital status: Widowed    Spouse name: Not on file    Number of children: Not on file   Years of education: Not on file   Highest education level: Not on file  Occupational History   Not on file  Tobacco Use   Smoking status: Every Day    Packs/day: 0.25    Types: Cigarettes   Smokeless tobacco: Never  Vaping Use   Vaping Use: Never used  Substance and Sexual Activity   Alcohol use: Yes    Comment: occas   Drug use: No   Sexual activity: Yes    Birth control/protection: Surgical  Other Topics Concern   Not on file  Social History Narrative   Not on file   Social Determinants of Health   Financial Resource Strain: Not on file  Food Insecurity: Not on file  Transportation Needs: Not on file  Physical Activity: Not on file  Stress: Not on file  Social Connections: Not on file  Intimate Partner Violence: Not on file    Outpatient Encounter Medications as of 11/26/2021  Medication Sig   aspirin 81 MG chewable tablet Chew 81 mg by mouth daily.   atorvastatin (LIPITOR) 40 MG tablet Take 1 tablet (40 mg total) by mouth daily.   citalopram (CELEXA) 20 MG tablet Take 1 tablet (20 mg total) by mouth daily.   cyclobenzaprine (FLEXERIL) 10 MG tablet Take 1 tablet by mouth three times daily as needed for  muscle spasm   DULoxetine (CYMBALTA) 60 MG capsule Take 1 capsule by mouth once daily   fenofibrate (TRICOR) 145 MG tablet Take 1 tablet (145 mg total) by mouth daily.   fluticasone (FLONASE) 50 MCG/ACT nasal spray Place 2 sprays into both nostrils daily as needed for allergies.   gabapentin (NEURONTIN) 300 MG capsule Take 3 capsules (900 mg total) by mouth 3 (three) times daily. for nerve pain/neuropathy   lisinopril (ZESTRIL) 10 MG tablet Take 1 tablet by mouth once daily   metFORMIN (GLUCOPHAGE) 1000 MG tablet TAKE 1 TABLET BY MOUTH TWICE DAILY WITH A MEAL   Multiple Vitamin (MULTIVITAMIN WITH MINERALS) TABS tablet Take 1 tablet by mouth daily.   Omega-3 Fatty Acids (FISH OIL PO) Take 1 capsule by mouth in the morning.   traMADol  (ULTRAM) 50 MG tablet Take 1 tablet (50 mg total) by mouth 3 (three) times daily as needed.   traZODone (DESYREL) 100 MG tablet Take 1 tablet (100 mg total) by mouth at bedtime as needed for sleep. (Patient taking differently: Take 150 mg by mouth at bedtime.)   omeprazole (PRILOSEC) 40 MG capsule Take 1 capsule (40 mg total) by mouth daily.   No facility-administered encounter medications on file as of 11/26/2021.    Allergies  Allergen Reactions   Penicillins Anaphylaxis    Review of Systems  Constitutional:  Negative for activity change, appetite change, chills, diaphoresis, fatigue, fever and unexpected weight change.  Eyes:  Negative for photophobia and visual disturbance.  Respiratory:  Negative for cough, choking, chest tightness, shortness of breath, wheezing and stridor.   Cardiovascular:  Negative for chest pain, palpitations and leg swelling.  Gastrointestinal:  Negative for abdominal pain.  Endocrine: Negative for polydipsia, polyphagia and polyuria.  Genitourinary:  Negative for decreased urine volume and difficulty urinating.  Neurological:  Negative for dizziness, tremors, seizures, syncope, facial asymmetry, speech difficulty, weakness, light-headedness, numbness and headaches.  Psychiatric/Behavioral:  Negative for confusion.   All other systems reviewed and are negative.       Objective:  BP 121/80   Pulse 86   Temp 98.5 F (36.9 C)   Ht 5' 5"  (1.651 m)   Wt 204 lb (92.5 kg)   LMP  (LMP Unknown)   SpO2 97%   BMI 33.95 kg/m    Wt Readings from Last 3 Encounters:  11/26/21 204 lb (92.5 kg)  09/24/21 204 lb 9.6 oz (92.8 kg)  09/24/21 204 lb 9.6 oz (92.8 kg)    Physical Exam Vitals and nursing note reviewed.  Constitutional:      Appearance: Normal appearance. She is obese.  HENT:     Head: Normocephalic and atraumatic.  Eyes:     Pupils: Pupils are equal, round, and reactive to light.  Cardiovascular:     Rate and Rhythm: Normal rate.  Pulmonary:      Effort: Pulmonary effort is normal.  Skin:    General: Skin is warm and dry.     Capillary Refill: Capillary refill takes less than 2 seconds.  Neurological:     General: No focal deficit present.     Mental Status: She is alert and oriented to person, place, and time.  Psychiatric:        Mood and Affect: Mood normal.        Behavior: Behavior normal.        Thought Content: Thought content normal.        Judgment: Judgment normal.     Results for orders  placed or performed in visit on 09/24/21  Bayer DCA Hb A1c Waived  Result Value Ref Range   HB A1C (BAYER DCA - WAIVED) 8.0 (H) 4.8 - 5.6 %  CBC with Differential/Platelet  Result Value Ref Range   WBC 8.1 3.4 - 10.8 x10E3/uL   RBC 4.46 3.77 - 5.28 x10E6/uL   Hemoglobin 14.3 11.1 - 15.9 g/dL   Hematocrit 42.4 34.0 - 46.6 %   MCV 95 79 - 97 fL   MCH 32.1 26.6 - 33.0 pg   MCHC 33.7 31.5 - 35.7 g/dL   RDW 12.6 11.7 - 15.4 %   Platelets 259 150 - 450 x10E3/uL   Neutrophils 49 Not Estab. %   Lymphs 43 Not Estab. %   Monocytes 5 Not Estab. %   Eos 2 Not Estab. %   Basos 1 Not Estab. %   Neutrophils Absolute 4.0 1.4 - 7.0 x10E3/uL   Lymphocytes Absolute 3.5 (H) 0.7 - 3.1 x10E3/uL   Monocytes Absolute 0.4 0.1 - 0.9 x10E3/uL   EOS (ABSOLUTE) 0.2 0.0 - 0.4 x10E3/uL   Basophils Absolute 0.1 0.0 - 0.2 x10E3/uL   Immature Granulocytes 0 Not Estab. %   Immature Grans (Abs) 0.0 0.0 - 0.1 x10E3/uL  CMP14+EGFR  Result Value Ref Range   Glucose 249 (H) 70 - 99 mg/dL   BUN 11 6 - 24 mg/dL   Creatinine, Ser 0.78 0.57 - 1.00 mg/dL   eGFR 90 >59 mL/min/1.73   BUN/Creatinine Ratio 14 9 - 23   Sodium 137 134 - 144 mmol/L   Potassium 4.4 3.5 - 5.2 mmol/L   Chloride 97 96 - 106 mmol/L   CO2 21 20 - 29 mmol/L   Calcium 9.8 8.7 - 10.2 mg/dL   Total Protein 7.4 6.0 - 8.5 g/dL   Albumin 4.5 3.8 - 4.9 g/dL   Globulin, Total 2.9 1.5 - 4.5 g/dL   Albumin/Globulin Ratio 1.6 1.2 - 2.2   Bilirubin Total 0.3 0.0 - 1.2 mg/dL   Alkaline  Phosphatase 69 44 - 121 IU/L   AST 22 0 - 40 IU/L   ALT 24 0 - 32 IU/L  Thyroid Panel With TSH  Result Value Ref Range   TSH 1.220 0.450 - 4.500 uIU/mL   T4, Total 7.9 4.5 - 12.0 ug/dL   T3 Uptake Ratio 22 (L) 24 - 39 %   Free Thyroxine Index 1.7 1.2 - 4.9  Lipid panel  Result Value Ref Range   Cholesterol, Total 139 100 - 199 mg/dL   Triglycerides 447 (H) 0 - 149 mg/dL   HDL 37 (L) >39 mg/dL   VLDL Cholesterol Cal 64 (H) 5 - 40 mg/dL   LDL Chol Calc (NIH) 38 0 - 99 mg/dL   Chol/HDL Ratio 3.8 0.0 - 4.4 ratio  Microalbumin / creatinine urine ratio  Result Value Ref Range   Creatinine, Urine 56.5 Not Estab. mg/dL   Microalbumin, Urine <3.0 Not Estab. ug/mL   Microalb/Creat Ratio <5 0 - 29 mg/g creat       Pertinent labs & imaging results that were available during my care of the patient were reviewed by me and considered in my medical decision making.  Assessment & Plan:  Diagnoses and all orders for this visit:  Driving safety issue Needs PFT to determine if able to complete breathalyzer testing in car. Will refer to pulmonology to get testing.  -     Ambulatory referral to Pulmonology     Continue all other maintenance  medications.  Follow up plan: Return in about 1 month (around 12/27/2021) for chronic follow up.   The above assessment and management plan was discussed with the patient. The patient verbalized understanding of and has agreed to the management plan. Patient is aware to call the clinic if they develop any new symptoms or if symptoms persist or worsen. Patient is aware when to return to the clinic for a follow-up visit. Patient educated on when it is appropriate to go to the emergency department.   Monia Pouch, FNP-C [facility] [phone]

## 2021-12-02 ENCOUNTER — Other Ambulatory Visit: Payer: Self-pay | Admitting: *Deleted

## 2021-12-02 DIAGNOSIS — R0600 Dyspnea, unspecified: Secondary | ICD-10-CM

## 2021-12-05 ENCOUNTER — Other Ambulatory Visit: Payer: Self-pay | Admitting: Physical Medicine and Rehabilitation

## 2021-12-06 NOTE — Telephone Encounter (Signed)
I did peer to peer review last Friday before went on vacation- can we check if it's been approved?- Thanks- ML

## 2021-12-13 ENCOUNTER — Telehealth: Payer: Self-pay

## 2021-12-23 ENCOUNTER — Ambulatory Visit: Payer: 59 | Admitting: Family Medicine

## 2021-12-27 ENCOUNTER — Other Ambulatory Visit: Payer: Self-pay | Admitting: Internal Medicine

## 2021-12-28 ENCOUNTER — Encounter: Payer: 59 | Admitting: Physical Medicine & Rehabilitation

## 2021-12-29 ENCOUNTER — Ambulatory Visit: Payer: 59 | Admitting: Family Medicine

## 2021-12-29 ENCOUNTER — Encounter: Payer: Self-pay | Admitting: Family Medicine

## 2022-01-06 ENCOUNTER — Other Ambulatory Visit: Payer: Self-pay | Admitting: Physical Medicine and Rehabilitation

## 2022-01-06 ENCOUNTER — Other Ambulatory Visit: Payer: Self-pay | Admitting: Family Medicine

## 2022-01-06 DIAGNOSIS — M62838 Other muscle spasm: Secondary | ICD-10-CM

## 2022-01-12 ENCOUNTER — Encounter: Payer: 59 | Admitting: Physical Medicine and Rehabilitation

## 2022-01-15 ENCOUNTER — Other Ambulatory Visit: Payer: Self-pay | Admitting: Family Medicine

## 2022-02-10 ENCOUNTER — Other Ambulatory Visit: Payer: Self-pay | Admitting: Family Medicine

## 2022-02-10 ENCOUNTER — Other Ambulatory Visit: Payer: Self-pay | Admitting: Physical Medicine and Rehabilitation

## 2022-02-10 DIAGNOSIS — M62838 Other muscle spasm: Secondary | ICD-10-CM

## 2022-02-25 ENCOUNTER — Ambulatory Visit: Payer: 59 | Admitting: Family Medicine

## 2022-03-09 ENCOUNTER — Other Ambulatory Visit: Payer: Self-pay | Admitting: Physical Medicine & Rehabilitation

## 2022-03-09 ENCOUNTER — Other Ambulatory Visit: Payer: Self-pay | Admitting: Family Medicine

## 2022-03-09 DIAGNOSIS — M62838 Other muscle spasm: Secondary | ICD-10-CM

## 2022-03-16 ENCOUNTER — Encounter: Payer: 59 | Admitting: Physical Medicine and Rehabilitation

## 2022-03-17 ENCOUNTER — Other Ambulatory Visit: Payer: Self-pay | Admitting: Family Medicine

## 2022-04-04 ENCOUNTER — Ambulatory Visit: Payer: 59 | Admitting: Family Medicine

## 2022-04-04 ENCOUNTER — Encounter: Payer: 59 | Admitting: Physical Medicine and Rehabilitation

## 2022-05-03 ENCOUNTER — Ambulatory Visit: Payer: Medicaid Other | Admitting: Family Medicine

## 2022-05-06 ENCOUNTER — Telehealth: Payer: Self-pay

## 2022-05-06 NOTE — Telephone Encounter (Signed)
PA for tramadol faxed on 05/06/22

## 2022-05-11 ENCOUNTER — Ambulatory Visit: Payer: Medicaid Other | Admitting: Family Medicine

## 2022-05-13 ENCOUNTER — Encounter: Payer: Medicaid Other | Admitting: Physical Medicine and Rehabilitation

## 2022-05-16 ENCOUNTER — Ambulatory Visit: Payer: Self-pay | Admitting: Physical Medicine and Rehabilitation

## 2022-05-23 ENCOUNTER — Ambulatory Visit: Payer: Medicaid Other | Admitting: Family Medicine

## 2022-06-28 ENCOUNTER — Other Ambulatory Visit: Payer: Self-pay | Admitting: Family Medicine

## 2022-07-22 ENCOUNTER — Encounter
Payer: Medicaid Other | Attending: Physical Medicine and Rehabilitation | Admitting: Physical Medicine and Rehabilitation

## 2022-07-30 ENCOUNTER — Other Ambulatory Visit: Payer: Self-pay | Admitting: Family Medicine

## 2022-07-30 DIAGNOSIS — E1169 Type 2 diabetes mellitus with other specified complication: Secondary | ICD-10-CM

## 2022-10-12 ENCOUNTER — Other Ambulatory Visit: Payer: Self-pay | Admitting: Physical Medicine and Rehabilitation

## 2022-12-28 ENCOUNTER — Ambulatory Visit (INDEPENDENT_AMBULATORY_CARE_PROVIDER_SITE_OTHER): Payer: MEDICAID | Admitting: Internal Medicine

## 2022-12-28 ENCOUNTER — Encounter: Payer: Self-pay | Admitting: Internal Medicine

## 2022-12-28 VITALS — BP 104/74 | HR 86 | Temp 97.9°F | Ht 65.0 in | Wt 188.7 lb

## 2022-12-28 DIAGNOSIS — R1310 Dysphagia, unspecified: Secondary | ICD-10-CM

## 2022-12-28 DIAGNOSIS — K760 Fatty (change of) liver, not elsewhere classified: Secondary | ICD-10-CM

## 2022-12-28 DIAGNOSIS — K219 Gastro-esophageal reflux disease without esophagitis: Secondary | ICD-10-CM

## 2022-12-28 DIAGNOSIS — R110 Nausea: Secondary | ICD-10-CM | POA: Diagnosis not present

## 2022-12-28 DIAGNOSIS — R1319 Other dysphagia: Secondary | ICD-10-CM

## 2022-12-28 MED ORDER — ONDANSETRON HCL 4 MG PO TABS: 4.0000 mg | ORAL_TABLET | Freq: Three times a day (TID) | ORAL | 1 refills | Status: DC | PRN

## 2022-12-28 MED ORDER — OMEPRAZOLE 40 MG PO CPDR: 40.0000 mg | DELAYED_RELEASE_CAPSULE | Freq: Every day | ORAL | 11 refills | Status: DC

## 2022-12-28 NOTE — Progress Notes (Signed)
Referring Provider: No ref. provider found Primary Care Physician:  Eliezer Lofts, MD Primary GI:  Dr. Marletta Lor  Chief Complaint  Patient presents with   Gastroesophageal Reflux    Follow up on GERD. States having acid reflux, stomach burning, nausea and sometimes feels like food gets stuck.     HPI:   Renee Reilly is a 57 y.o. female who presents to the clinic today for follow-up visit.  Has not been seen in our office since February 2023.  Multiple complaints for me today.  Chronic GERD: Currently uncontrolled, having frequent episodes of acid reflux and heartburn.  This is led to nausea as well.  This was previously well-controlled omeprazole daily but she states her PCP stopped this medication approximately 6 months ago and she is now "miserable."  EGD 04/22/21 gastritis, biopsies negative for H. pylori.  Empirically dilated due to dysphagia.  Esophageal dysphagia: Symptoms have started to recur as well since stopping her PPI therapy.  This was previously well-controlled.  MASLD: CT coronary 12/21/2020 with evidence of hepatic steatosis.  Ultrasound with elastography 04/06/2021 with hepatic steatosis, mildly dilated CBD likely related to postcholecystectomy changes.  Historically normal LFTs. Patient has history of diabetes, obesity, dyslipidemia.  Denies any previous or current alcohol use.  Does note her mother died of liver cancer unknown if primary or mets.  Also unsure if she had cirrhosis.   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  I do not have access to this report.  Past Medical History:  Diagnosis Date   Anxiety    CAD (coronary artery disease), native coronary artery    coronary Ca score of 7.1 and mild CAD  of the RCA with soft plaque < 50% in the mid RCA   Carpal tunnel syndrome    Depression    Hypertension     Past Surgical History:  Procedure Laterality Date   BALLOON DILATION N/A 04/22/2021   Procedure: BALLOON DILATION;   Surgeon: [name], DO;  Location: [facility];  Service: Endoscopy;  Laterality: N/A;   BIOPSY  04/22/2021   Procedure: BIOPSY;  Surgeon: [name], DO;  Location: [facility];  Service: Endoscopy;;   COLONOSCOPY WITH PROPOFOL N/A 04/22/2021   Surgeon: [name], DO; nonbleeding internal hemorrhoids.  Repeat in 10 years.   ESOPHAGOGASTRODUODENOSCOPY (EGD) WITH PROPOFOL N/A 04/22/2021   Surgeon: [name], DO;  Normal esophagus s/p empiric dilation, erythematous mucosa in the gastric body and antrum biopsied, normal examined duodenum.   TUBAL LIGATION      Current Outpatient Medications  Medication Sig Dispense Refill   aspirin 81 MG chewable tablet Chew 81 mg by mouth daily.     citalopram (CELEXA) 20 MG tablet Take 1 tablet (20 mg total) by mouth daily. 90 tablet 2   fenofibrate (TRICOR) 145 MG tablet Take 1 tablet (145 mg total) by mouth daily. 90 tablet 2   fluticasone (FLONASE) 50 MCG/ACT nasal spray Place 2 sprays into both nostrils daily as needed for allergies.     JARDIANCE 10 MG TABS tablet Take 10 mg by mouth daily.     lisinopril (ZESTRIL) 10 MG tablet Take 1 tablet by mouth once daily 90 tablet 0   metFORMIN (GLUCOPHAGE) 1000 MG tablet TAKE 1 TABLET BY MOUTH TWICE DAILY WITH A MEAL 180 tablet 0   Multiple Vitamin (MULTIVITAMIN WITH MINERALS) TABS tablet Take 1 tablet by mouth daily.     oxyCODONE-acetaminophen (  PERCOCET/ROXICET) 5-325 MG tablet SMARTSIG:0.5-1 Tablet(s) By Mouth 1-2 Times Daily PRN     pravastatin (PRAVACHOL) 10 MG tablet Take 10 mg by mouth daily.     traMADol (ULTRAM) 50 MG tablet Take 1 tablet by mouth three times daily as needed 90 tablet 3   traZODone (DESYREL) 100 MG tablet Take 1 tablet (100 mg total) by mouth at bedtime as needed for sleep. (Patient taking differently: Take 150 mg by mouth at bedtime.) 30 tablet 0   cyclobenzaprine (FLEXERIL) 10 MG tablet Take 1 tablet by mouth three times daily as needed for muscle  spasm (Patient not taking: Reported on 12/28/2022) 90 tablet 5   DULoxetine (CYMBALTA) 60 MG capsule Take 1 capsule by mouth once daily (Patient not taking: Reported on 12/28/2022) 90 capsule 0   gabapentin (NEURONTIN) 300 MG capsule Take 3 capsules (900 mg total) by mouth 3 (three) times daily. for nerve pain/neuropathy (Patient not taking: Reported on 12/28/2022) 270 capsule 5   omeprazole (PRILOSEC) 40 MG capsule Take 1 capsule (40 mg total) by mouth daily. (Patient not taking: Reported on 12/28/2022) 30 capsule 11   No current facility-administered medications for this visit.    Allergies as of 12/28/2022 - Review Complete 11/26/2021  Allergen Reaction Noted   Penicillins Anaphylaxis 02/11/2016    Family History  Problem Relation Age of Onset   Cancer Mother        Liver cancer   Diabetes Brother    Cancer Other     Social History   Socioeconomic History   Marital status: Widowed    Spouse name: Not on file   Number of children: Not on file   Years of education: Not on file   Highest education level: Not on file  Occupational History   Not on file  Tobacco Use   Smoking status: Every Day    Current packs/day: 0.25    Types: Cigarettes   Smokeless tobacco: Never  Vaping Use   Vaping status: Never Used  Substance and Sexual Activity   Alcohol use: Yes    Comment: occas   Drug use: No   Sexual activity: Yes    Birth control/protection: Surgical  Other Topics Concern   Not on file  Social History Narrative   Not on file   Social Determinants of Health   Financial Resource Strain: Not on file  Food Insecurity: Not on file  Transportation Needs: Not on file  Physical Activity: Not on file  Stress: Not on file  Social Connections: Not on file    Subjective: Review of Systems  Constitutional:  Negative for chills and fever.  HENT:  Negative for congestion and hearing loss.   Eyes:  Negative for blurred vision and double vision.  Respiratory:  Negative for cough  and shortness of breath.   Cardiovascular:  Negative for chest pain and palpitations.  Gastrointestinal:  Positive for heartburn and nausea. Negative for abdominal pain, blood in stool, constipation, diarrhea, melena and vomiting.  Genitourinary:  Negative for dysuria and urgency.  Musculoskeletal:  Negative for joint pain and myalgias.  Skin:  Negative for itching and rash.  Neurological:  Negative for dizziness and headaches.  Psychiatric/Behavioral:  Negative for depression. The patient is not nervous/anxious.      Objective: BP 104/74 (BP Location: Left Arm, Patient Position: Sitting, Cuff Size: Normal)   Pulse 86   Temp 97.9 F (36.6 C) (Oral)   Ht 5\' 5"  (1.651 m)   Wt 188 lb 11.2 oz (85.6  kg)   LMP  (LMP Unknown)   BMI 31.40 kg/m  Physical Exam Constitutional:      Appearance: Normal appearance.  HENT:     Head: Normocephalic and atraumatic.  Eyes:     Extraocular Movements: Extraocular movements intact.     Conjunctiva/sclera: Conjunctivae normal.  Cardiovascular:     Rate and Rhythm: Normal rate and regular rhythm.  Pulmonary:     Effort: Pulmonary effort is normal.     Breath sounds: Normal breath sounds.  Abdominal:     General: Bowel sounds are normal.     Palpations: Abdomen is soft.  Musculoskeletal:        General: No swelling. Normal range of motion.     Cervical back: Normal range of motion and neck supple.  Skin:    General: Skin is warm and dry.     Coloration: Skin is not jaundiced.  Neurological:     General: No focal deficit present.     Mental Status: She is alert and oriented to person, place, and time.  Psychiatric:        Mood and Affect: Mood normal.        Behavior: Behavior normal.      Assessment/Plan:  1.  Chronic GERD-uncontrolled since stopping PPI therapy approximately 5 months ago.  Will restart her on omeprazole 40 mg daily.  2.  Esophageal dysphagia-symptoms mild, intermittent.  Continue to monitor.  Follow-up in 3 to 4  months, if not improved on PPI therapy consider repeat upper endoscopy with empiric dilation.  3.  Nausea-Zofran sent to pharmacy to take as needed.  Hopefully this steadily improves as we get her reflux under better control.  4.  MASLD-  FIB4 1.25.   Counseled on importance of keeping her diabetes and high cholesterol under good control.   Recommend 1-2# weight loss per week until ideal body weight through exercise & diet. Low fat/cholesterol diet.   Avoid sweets, sodas, fruit juices, sweetened beverages like tea, etc. Gradually increase exercise from 15 min daily up to 1 hr per day 5 days/week. Limit alcohol use.  Follow-up in 3 to 4 months. 12/28/2022 2:02 PM   Disclaimer: This note was dictated with voice recognition software. Similar sounding words can inadvertently be transcribed and may not be corrected upon review.

## 2022-12-28 NOTE — Patient Instructions (Signed)
I am going to restart you on omeprazole 40 mg daily.  This medication works best if you take it at least 30 minutes for breakfast.  I will also send in Zofran to take as needed for nausea.  Recommend 1-2# weight loss per week until ideal body weight through exercise & diet. Low fat/cholesterol diet.   Avoid sweets, sodas, fruit juices, sweetened beverages like tea, etc. Gradually increase exercise from 15 min daily up to 1 hr per day 5 days/week. Limit alcohol use.  Follow-up in 3 to 4 months.  If symptoms are not improved, we may consider repeating your upper endoscopy.  It was very nice seeing you again today.  Dr. Marletta Lor

## 2023-03-04 ENCOUNTER — Other Ambulatory Visit: Payer: Self-pay | Admitting: Internal Medicine

## 2023-03-15 ENCOUNTER — Other Ambulatory Visit: Payer: Self-pay

## 2023-03-15 DIAGNOSIS — R110 Nausea: Secondary | ICD-10-CM

## 2023-03-15 DIAGNOSIS — K219 Gastro-esophageal reflux disease without esophagitis: Secondary | ICD-10-CM

## 2023-03-15 MED ORDER — ONDANSETRON HCL 4 MG PO TABS: 4.0000 mg | ORAL_TABLET | Freq: Three times a day (TID) | ORAL | 1 refills | Status: DC | PRN

## 2023-03-20 IMAGING — MR MR LUMBAR SPINE W/O CM
5 series · 31 of 48 positions shown · non-contrast
Comparison: None.

CLINICAL DATA: Lumbar radiculopathy, symptoms persist with > 6 wks
treatment chronic low back pain with radiculopathy

EXAM:
MRI LUMBAR SPINE WITHOUT CONTRAST
TECHNIQUE: Multiplanar, multisequence MR imaging of the lumbar spine was
performed. No intravenous contrast was administered.

[Series 5: T2 · sagittal · 4.0mm · 0.68mm/px · 6 of 15 slices shown (1 of 2)]
[im 1/15]
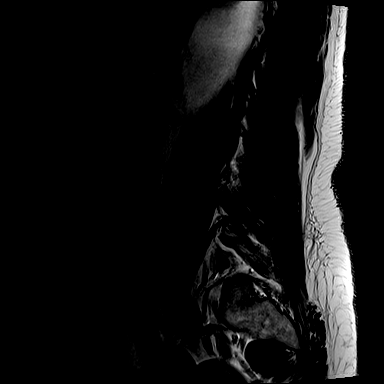
[im 3/15]
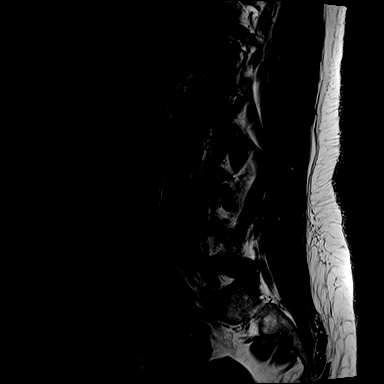
[im 6/15]
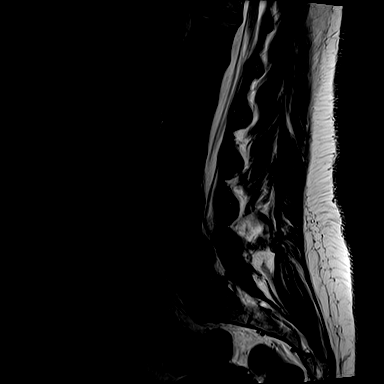
[im 9/15]
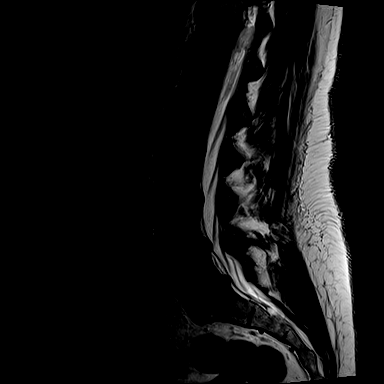
[im 12/15]
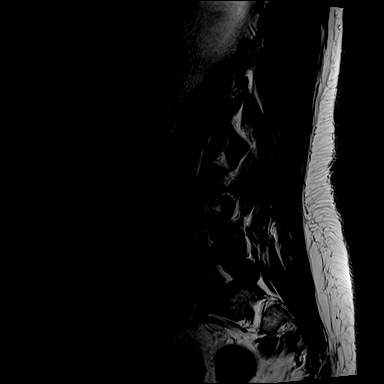
[im 15/15]
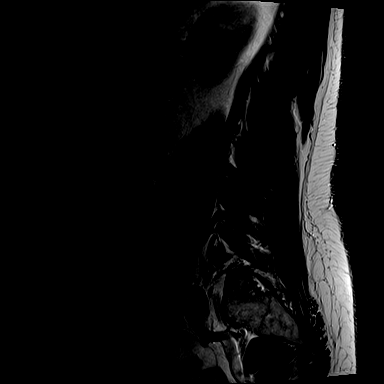

[Series 6: T1 · sagittal · 4.0mm · 0.81mm/px · 7 of 15 slices shown (1 of 2)]
[im 1/15]
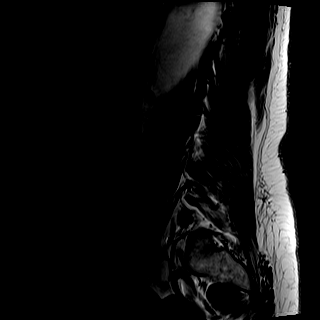
[im 3/15]
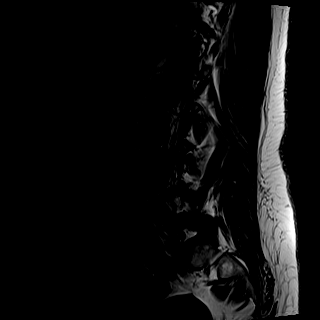
[im 5/15]
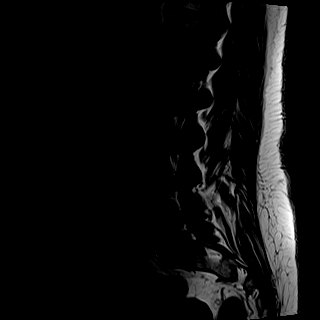
[im 8/15]
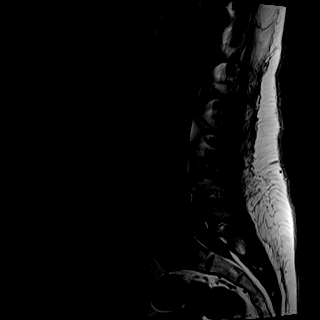
[im 10/15]
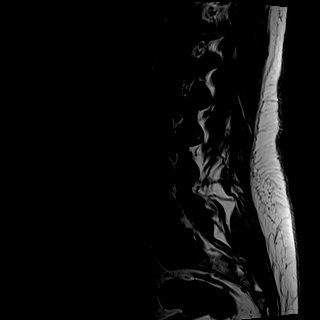
[im 12/15]
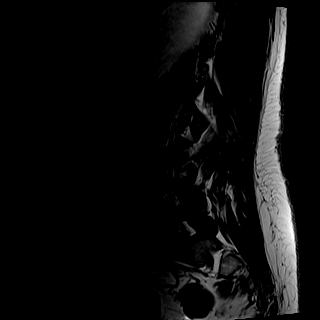
[im 15/15]
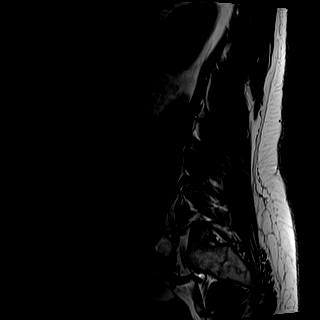

[Series 7: STIR · sagittal · 4.0mm · 0.51mm/px · 2 of 15 slices shown]
[im 1/15]
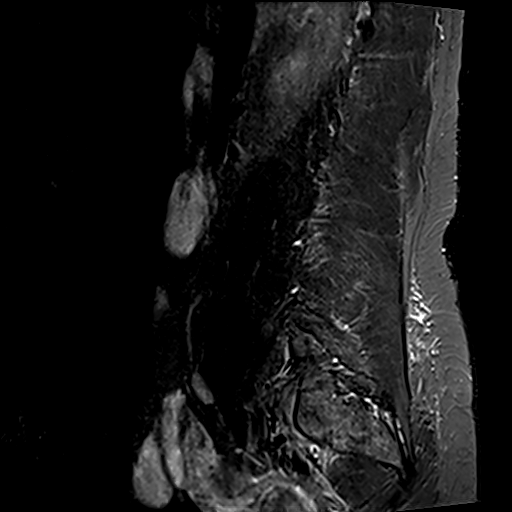
[im 3/15]
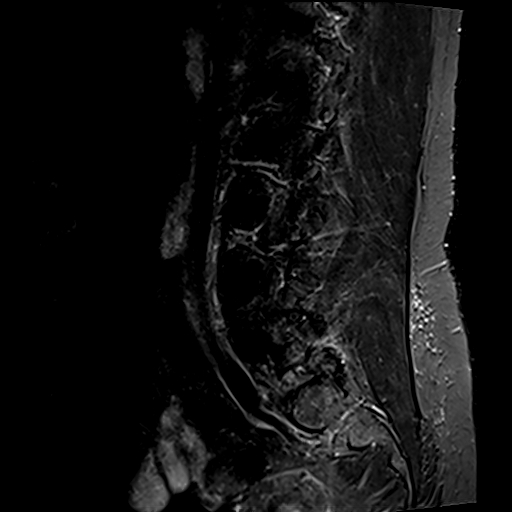

[Series 8: T2 · axial · 4.0mm · 0.70mm/px · z∈[-108,+80]mm · 8 of 32 slices shown (2 of 2)]
[im 1/32]
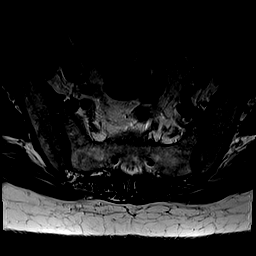
[im 5/32]
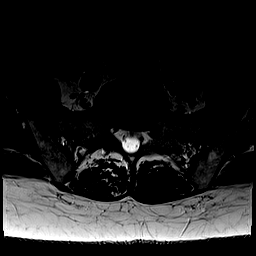
[im 10/32]
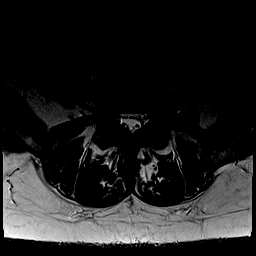
[im 15/32]
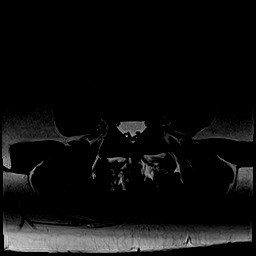
[im 17/32]
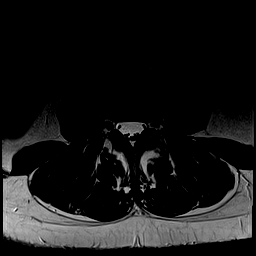
[im 22/32]
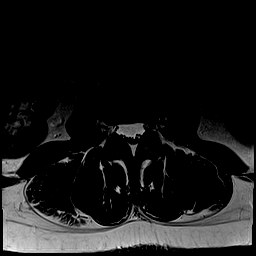
[im 27/32]
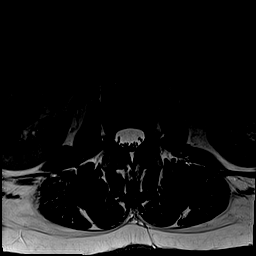
[im 32/32]
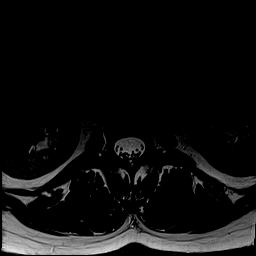

[Series 9: T1 · axial · 4.0mm · 0.35mm/px · z∈[-108,+80]mm · 8 of 32 slices shown (2 of 2)]
[im 1/32]
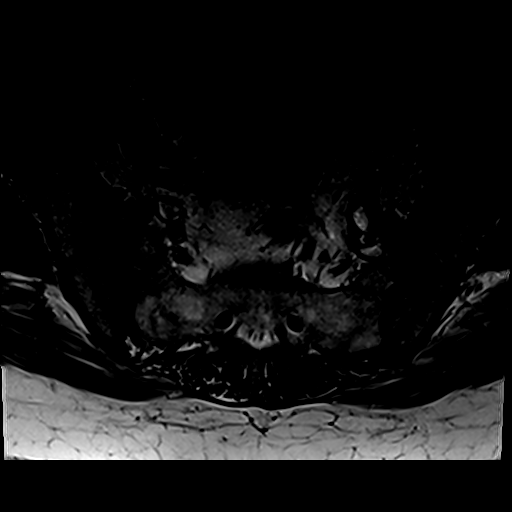
[im 5/32]
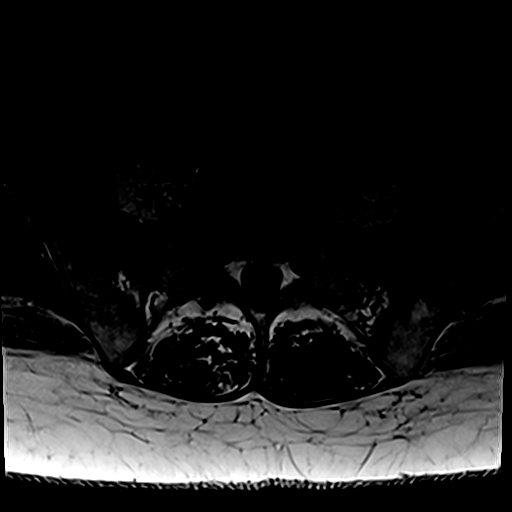
[im 10/32]
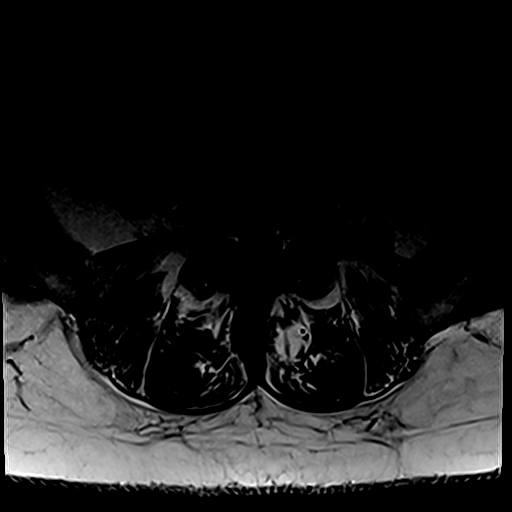
[im 15/32]
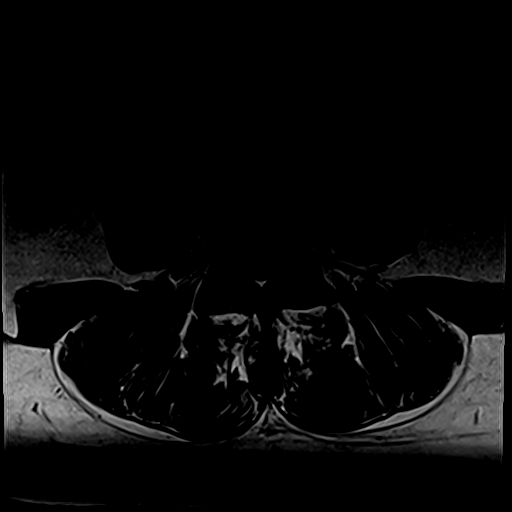
[im 17/32]
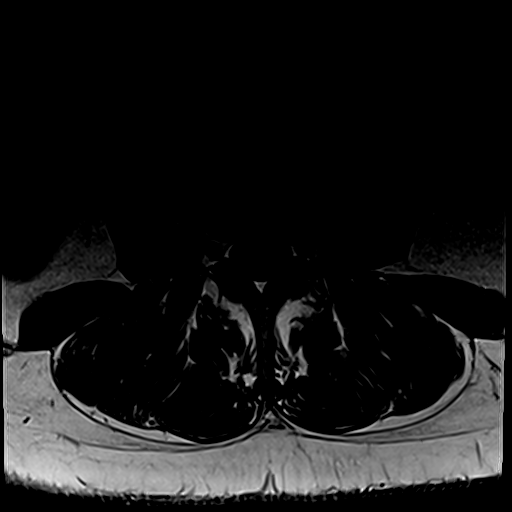
[im 22/32]
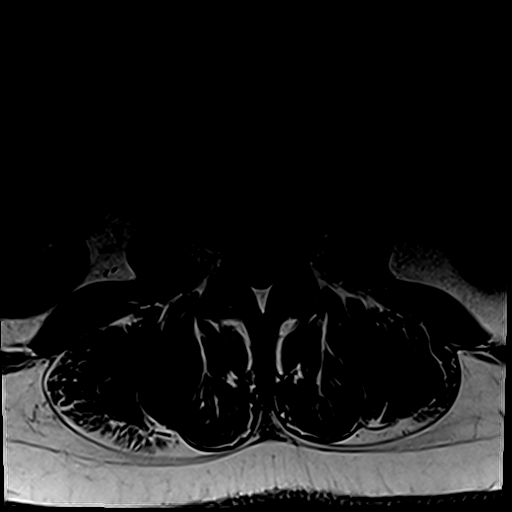
[im 27/32]
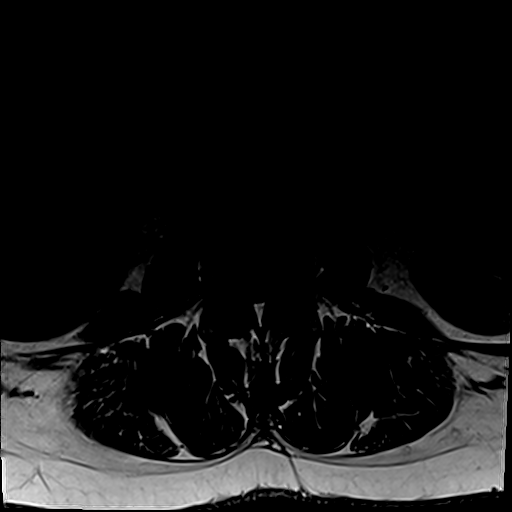
[im 32/32]
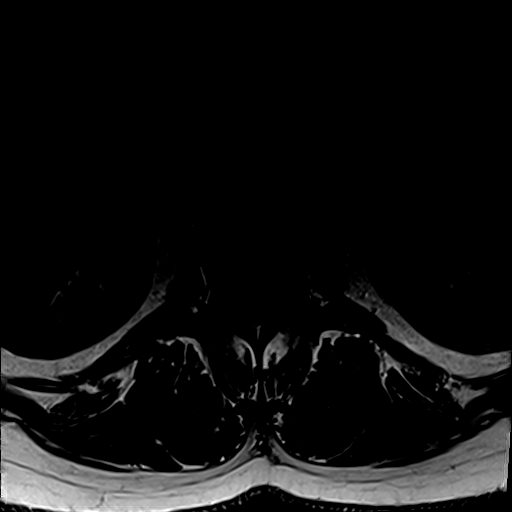

[31 of 48 positions shown; findings below may reference images not displayed]

FINDINGS: Segmentation:  Standard.

Alignment:  Preserved.

Vertebrae: Vertebral body heights are maintained. There is
degenerative endplate marrow edema eccentric to the right at L5-S1.
No suspicious osseous lesion.

Conus medullaris and cauda equina: Conus extends to the T12-L1
level. Conus and cauda equina appear normal.

Paraspinal and other soft tissues: Unremarkable.

Disc levels: Imaged in the sagittal plane only, there is a small
central protrusion at T11-T12.

L1-L2:  No canal or foraminal stenosis.

L2-L3:  Minimal disc bulge.  No canal or foraminal stenosis.

L3-L4:  Minimal disc bulge.  No canal or foraminal stenosis.

L4-L5: Disc bulge slightly eccentric to the left with endplate
osteophytic ridging. Moderate facet arthropathy with ligamentum
flavum infolding. Mild canal stenosis with slight effacement of
subarticular recesses. No right foraminal stenosis. Mild left
foraminal stenosis.

L5-S1: Disc bulge with endplate osteophytic ridging and superimposed
shallow central protrusion. Moderate right and mild left facet
arthropathy. No canal stenosis. Slight effacement of the right
subarticular recess. Mild right and minor left foraminal stenosis.
IMPRESSION: Primarily lower lumbar degenerative changes as detailed above
without high-grade stenosis.

## 2023-04-27 ENCOUNTER — Encounter: Payer: Self-pay | Admitting: Gastroenterology

## 2023-04-27 ENCOUNTER — Ambulatory Visit: Payer: MEDICAID | Admitting: Gastroenterology

## 2023-05-07 ENCOUNTER — Encounter (HOSPITAL_COMMUNITY): Payer: Self-pay | Admitting: Emergency Medicine

## 2023-05-07 ENCOUNTER — Other Ambulatory Visit: Payer: Self-pay

## 2023-05-07 ENCOUNTER — Emergency Department (HOSPITAL_COMMUNITY): Payer: MEDICAID

## 2023-05-07 ENCOUNTER — Emergency Department (HOSPITAL_COMMUNITY)
Admission: EM | Admit: 2023-05-07 | Discharge: 2023-05-07 | Disposition: A | Discharge status: Home / Self Care | Payer: MEDICAID | Attending: Emergency Medicine | Admitting: Emergency Medicine

## 2023-05-07 DIAGNOSIS — S82832A Other fracture of upper and lower end of left fibula, initial encounter for closed fracture: Secondary | ICD-10-CM | POA: Diagnosis not present

## 2023-05-07 DIAGNOSIS — W1839XA Other fall on same level, initial encounter: Secondary | ICD-10-CM | POA: Diagnosis not present

## 2023-05-07 DIAGNOSIS — M79605 Pain in left leg: Secondary | ICD-10-CM | POA: Diagnosis present

## 2023-05-07 MED ORDER — OXYCODONE-ACETAMINOPHEN 5-325 MG PO TABS
1.0000 | ORAL_TABLET | Freq: Once | ORAL | Status: AC
  Administered 2023-05-07: 1 via ORAL
  Filled 2023-05-07: qty 1

## 2023-05-07 MED ORDER — OXYCODONE-ACETAMINOPHEN 7.5-325 MG PO TABS: 1.0000 | ORAL_TABLET | Freq: Four times a day (QID) | ORAL | 0 refills | Status: DC | PRN

## 2023-05-07 NOTE — ED Triage Notes (Signed)
 Pt bib pov w/ c/o a left ankle injury that happened about 2 hours ago. Pt reports she has neuropathy in her feet and falls a lot. This fall caused her leg to go under her and her body fell on her leg. When she hit the ground she heard a pop. Pain was instant. Ankle is painful and swollen. Pt reports she is unable to bare and weight on the left foot.

## 2023-05-07 NOTE — ED Provider Notes (Signed)
 [place] [facility]MENT AT [facility] Provider Note   CSN: [id number] Arrival date & time: 05/07/23  1302     History  Chief Complaint  Patient presents with   Ankle Injury    Renee Reilly is a 58 y.o. female with history of peripheral neuropathy which leads to infrequent falls, fell today landing with her left leg underneath her,  hyperextending the left ankle.  She has severe pain at the site,  denies radiation of pain, no knee or foot pain. She has had no treatment prior to arrival.  She was unable to weight bear after the fall and denies any other injury including head injury.  The history is provided by the patient.       Home Medications Prior to Admission medications   Medication Sig Start Date End Date Taking? Authorizing Provider  oxyCODONE-acetaminophen (PERCOCET) 7.5-325 MG tablet Take 1 tablet by mouth every 6 (six) hours as needed for severe pain (pain score 7-10). 05/07/23  Yes Tesia Lybrand, Raynelle Fanning, PA-C  aspirin 81 MG chewable tablet Chew 81 mg by mouth daily.    [provider]  citalopram (CELEXA) 20 MG tablet Take 1 tablet (20 mg total) by mouth daily. 12/29/20   Sonny Masters, FNP  fenofibrate (TRICOR) 145 MG tablet Take 1 tablet (145 mg total) by mouth daily. 12/29/20 04/22/23  Sonny Masters, FNP  fluticasone (FLONASE) 50 MCG/ACT nasal spray Place 2 sprays into both nostrils daily as needed for allergies. 04/14/21   [provider]  JARDIANCE 10 MG TABS tablet Take 10 mg by mouth daily. 07/24/22   [provider]  lisinopril (ZESTRIL) 10 MG tablet Take 1 tablet by mouth once daily 10/15/21   Sonny Masters, FNP  metFORMIN (GLUCOPHAGE) 1000 MG tablet TAKE 1 TABLET BY MOUTH TWICE DAILY WITH A MEAL 10/15/21   Sonny Masters, FNP  Multiple Vitamin (MULTIVITAMIN WITH MINERALS) TABS tablet Take 1 tablet by mouth daily.    [provider]  omeprazole (PRILOSEC) 40 MG capsule Take 1 capsule (40 mg total) by mouth daily. 12/28/22  12/28/23  [name], DO  ondansetron (ZOFRAN) 4 MG tablet Take 1 tablet (4 mg total) by mouth every 8 (eight) hours as needed for nausea or vomiting. 03/15/23   [name], DO  oxyCODONE-acetaminophen (PERCOCET/ROXICET) 5-325 MG tablet SMARTSIG:0.5-1 Tablet(s) By Mouth 1-2 Times Daily PRN 10/19/22   [provider]  pravastatin (PRAVACHOL) 10 MG tablet Take 10 mg by mouth daily. 12/26/22   [provider]  traMADol (ULTRAM) 50 MG tablet Take 1 tablet by mouth three times daily as needed 03/10/22   Lovorn, Aundra Millet, MD  traZODone (DESYREL) 100 MG tablet Take 1 tablet (100 mg total) by mouth at bedtime as needed for sleep. Patient taking differently: Take 150 mg by mouth at bedtime. 12/29/20   Sonny Masters, FNP      Allergies    Penicillins    Review of Systems   Review of Systems  Musculoskeletal:  Positive for arthralgias and joint swelling.  Skin:  Negative for wound.  Neurological:  Negative for weakness and numbness.    Physical Exam Updated Vital Signs BP 102/87 (BP Location: Right Arm)   Pulse 85   Temp 98.2 F (36.8 C) (Oral)   Resp (!) 22   Ht 5\' 5"  (1.651 m)   Wt 89.4 kg   LMP  (LMP Unknown)   SpO2 95%   BMI 32.78 kg/m  Physical Exam Vitals  and nursing note reviewed.  Constitutional:      Appearance: She is well-developed.  HENT:     Head: Normocephalic.  Cardiovascular:     Rate and Rhythm: Normal rate.     Pulses: Normal pulses. No decreased pulses.          Dorsalis pedis pulses are 2+ on the right side and 2+ on the left side.       Posterior tibial pulses are 2+ on the right side and 2+ on the left side.  Musculoskeletal:        General: Swelling and tenderness present. No deformity.     Left ankle: Swelling present. No ecchymosis. Tenderness present over the lateral malleolus. No proximal fibula tenderness. Decreased range of motion. Normal pulse.     Left Achilles Tendon: Normal.  Skin:    General: Skin is warm and dry.      Findings: No lesion.  Neurological:     Mental Status: She is alert.     Sensory: No sensory deficit.     ED Results / Procedures / Treatments   Labs (all labs ordered are listed, but only abnormal results are displayed) Labs Reviewed - No data to display  EKG None  Radiology DG Ankle Complete Left Result Date: 05/07/2023 CLINICAL DATA:  Hyperextension injury.  Fall. EXAM: LEFT ANKLE COMPLETE - 3+ VIEW COMPARISON:  None Available. FINDINGS: There are two oblique linear lucencies within the distal fibular diaphysis and metaphysis indicating an acute nondisplaced fracture. Moderate lateral malleolar soft tissue swelling. The ankle mortise is symmetric and intact. Small plantar and posterior calcaneal heel spurs. 3 mm ossicle overlying the anterior superior border of the talar dome on lateral view, age indeterminate. Mild distal medial and lateral malleolar degenerative spurring. IMPRESSION: 1. Acute nondisplaced oblique fracture of the distal fibular diaphysis and metaphysis. 2. There is a 3 mm ossicle overlying the anterior superior border of the talar dome on lateral view, age indeterminate. Electronically Signed   By: Neita Garnet M.D.   On: 05/07/2023 13:50    Procedures Procedures    Medications Ordered in ED Medications  oxyCODONE-acetaminophen (PERCOCET/ROXICET) 5-325 MG per tablet 1 tablet (1 tablet Oral Given 05/07/23 1443)    ED Course/ Medical Decision Making/ A&P                                 Medical Decision Making Patient with left ankle pain and swelling after falling with a hyperextension injury, concerning for fracture versus sprain/dislocation.  She is neurovascularly intact.  Imaging as below, provide patient is placed in a cam walker, she has a walker which she will use to minimize weightbearing.  We discussed home treatment including ice and elevation, follow-up care with orthopedics.  She was given several referrals for this including our on-call orthopedist,  although patient may like to keep her care local to [facility].  She is chronically on oxycodone for chronic neuropathy pain issues, she generally takes 5 mg tablets twice daily but sometimes has to increase to 7.5 mg.  She will be prescribed a 5-day course of 7.5 mg tablets, she was asked to hold her regular prescription till she has completed this increased dose.  Further pain management per Ortho follow-up.  Amount and/or Complexity of Data Reviewed Radiology: ordered.    Details: Reviewed, agree with interpretation, she has nondisplaced linear fracture through her distal left fibula.  She does endorse prior injury to  the site, I suspect the 3 mm ossicle is a chronic old injury.  Risk Prescription drug management.           Final Clinical Impression(s) / ED Diagnoses Final diagnoses:  Closed fracture of distal end of left fibula, unspecified fracture morphology, initial encounter    Rx / DC Orders ED Discharge Orders          Ordered    Apply cam walker        05/07/23 1453    oxyCODONE-acetaminophen (PERCOCET) 7.5-325 MG tablet  Every 6 hours PRN        05/07/23 1511              Burgess Amor, PA-C 05/07/23 1517    Bethann Berkshire, MD 05/08/23 0930

## 2023-05-07 NOTE — Discharge Instructions (Addendum)
 Wear the boot we supplied you at all times to protect your injury.  Use your walker to minimize weightbearing on the side as well.  Ice and elevation is much as you can for the next several days will help with pain and swelling.  Expect to develop bruising over the next several days this is normal after this type of injury.  Plan to follow-up with orthopedics as discussed.  I have prescribed you an oxycodone 7.5 mg tablet to cover you for this increased pain.  Hold your chronic pain medicine while on this higher dose.

## 2023-05-22 ENCOUNTER — Other Ambulatory Visit: Payer: Self-pay | Admitting: Internal Medicine

## 2023-05-22 DIAGNOSIS — R110 Nausea: Secondary | ICD-10-CM

## 2023-05-22 DIAGNOSIS — K219 Gastro-esophageal reflux disease without esophagitis: Secondary | ICD-10-CM

## 2023-05-29 ENCOUNTER — Other Ambulatory Visit: Payer: Self-pay

## 2023-05-29 ENCOUNTER — Encounter (HOSPITAL_COMMUNITY): Payer: Self-pay | Admitting: Orthopedic Surgery

## 2023-05-29 NOTE — H&P (Signed)
 Orthopaedic Trauma Service (OTS) Consult   Patient ID: Renee Reilly MRN: [id number] DOB/AGE: [birth year] 58 y.o.    HPI: Renee Reilly is an 58 y.o. female with history of peripheral neuropathy who sustained a fall on 05/07/2023.  Patient was found to have a left ankle fracture.  She was splinted and discharged from the emergency room.  She presented for follow-up with orthopedic trauma service.  Films were reviewed and felt that it was a surgical pattern.  Patient agrees to proceed with surgical intervention to address her ankle and possibly her syndesmosis.  Anticipate outpatient procedure.  Hx of DM and CAD   Echo done on December 2022 shows a left ventricular EF of 55 to 60%  Past Medical History:  Diagnosis Date   Anxiety    CAD (coronary artery disease), native coronary artery    coronary Ca score of 7.1 and mild CAD  of the RCA with soft plaque < 50% in the mid RCA   Carpal tunnel syndrome    Depression    Hypertension     Past Surgical History:  Procedure Laterality Date   BALLOON DILATION N/A 04/22/2021   Procedure: BALLOON DILATION;  Surgeon: [name], DO;  Location: [facility];  Service: Endoscopy;  Laterality: N/A;   BIOPSY  04/22/2021   Procedure: BIOPSY;  Surgeon: [name], DO;  Location: [facility] SUITE;  Service: Endoscopy;;   COLONOSCOPY WITH PROPOFOL N/A 04/22/2021   Surgeon: [name], DO; nonbleeding internal hemorrhoids.  Repeat in 10 years.   ESOPHAGOGASTRODUODENOSCOPY (EGD) WITH PROPOFOL N/A 04/22/2021   Surgeon: [name], DO;  Normal esophagus s/p empiric dilation, erythematous mucosa in the gastric body and antrum biopsied, normal examined duodenum.   TUBAL LIGATION      Family History  Problem Relation Age of Onset   Cancer Mother        Liver cancer   Diabetes Brother    Cancer Other     Social History:  reports that she has been smoking cigarettes. She has never used smokeless tobacco. She  reports current alcohol use. She reports that she does not use drugs.  Allergies:  Allergies  Allergen Reactions   Penicillins Anaphylaxis    Medications:   Current Meds  Medication Sig   acetaminophen (TYLENOL) 650 MG CR tablet Take 650 mg by mouth every 8 (eight) hours as needed for pain.   aspirin 81 MG chewable tablet Chew 81 mg by mouth daily.   citalopram (CELEXA) 20 MG tablet Take 1 tablet (20 mg total) by mouth daily.   fenofibrate (TRICOR) 145 MG tablet Take 145 mg by mouth daily.   fluticasone (FLONASE) 50 MCG/ACT nasal spray Place 2 sprays into both nostrils daily as needed for allergies.   gabapentin (NEURONTIN) 300 MG capsule Take 600 mg by mouth 2 (two) times daily.   JARDIANCE 10 MG TABS tablet Take 10 mg by mouth daily.   lisinopril (ZESTRIL) 10 MG tablet Take 1 tablet by mouth once daily   metFORMIN (GLUCOPHAGE) 1000 MG tablet TAKE 1 TABLET BY MOUTH TWICE DAILY WITH A MEAL   methocarbamol (ROBAXIN) 500 MG tablet Take 500 mg by mouth 2 (two) times daily as needed for muscle spasms.   Multiple Vitamin (MULTIVITAMIN WITH MINERALS) TABS tablet Take 1 tablet by mouth daily.   omeprazole (PRILOSEC) 40 MG capsule Take 1 capsule (40 mg total) by mouth daily.   ondansetron (ZOFRAN) 4 MG tablet  TAKE 1 TABLET BY MOUTH EVERY 8 HOURS AS NEEDED FOR NAUSEA FOR VOMITING   oxyCODONE-acetaminophen (PERCOCET/ROXICET) 5-325 MG tablet Take 1 tablet by mouth 2 (two) times daily.   pravastatin (PRAVACHOL) 10 MG tablet Take 10 mg by mouth daily.   traZODone (DESYREL) 100 MG tablet Take 1 tablet (100 mg total) by mouth at bedtime as needed for sleep. (Patient taking differently: Take 150 mg by mouth at bedtime.)     No results found for this or any previous visit (from the past 48 hours).  No results found.  Intake/Output    None      ROS As above There were no vitals taken for this visit. Physical Exam Gen: A&O, NAD, pleasant Left Lower Extremity   Splint c/d/I  Ext warm   +  DP pulse  DPN, SPN, TN sensation at baseline  EHL, FHL, lesser toe motor intact  Swelling stable    Assessment/Plan:  58 year old female ground-level fall with left ankle fracture  -Left ankle fracture, Weber B  Recommend ORIF.  Patient presents today for this.  Will assess syndesmosis after fibula is fixed and address as needed  Nonweightbearing for 6 to 8 weeks postop  Outpatient surgery  Risks and benefits reviewed with the patient she wished to proceed  - DVT/PE prophylaxis:  ASA at discharge for 30 days 81 mg twice daily - ID:   Reported allergy to penicillin.  Will give test dose of Ancef - Metabolic Bone Disease:  Check vitamin D levels given mechanism and diabetes history  - Impediments to fracture healing:  Diabetes, CAD - Dispo:  OR for ORIF left ankle   Mearl Latin, PA-C [phone] (C) 05/29/2023, 10:44 AM  [facility] [address] [place] [address] [phone] [phone] (F)    After 5pm and on the weekends please log on to Amion, go to orthopaedics and the look under the [facility] Call for the provider(s) on call. You can also call our office at [phone] and then follow the prompts to be connected to the call team.

## 2023-05-29 NOTE — Anesthesia Preprocedure Evaluation (Signed)
 Anesthesia Evaluation  Patient identified by MRN, date of birth, ID band Patient awake    Reviewed: Allergy & Precautions, NPO status , Patient's Chart, lab work & pertinent test results, reviewed documented beta blocker date and time   History of Anesthesia Complications Negative for: history of anesthetic complications  Airway Mallampati: IV  TM Distance: >3 FB     Dental  (+) Edentulous Upper, Edentulous Lower   Pulmonary neg COPD, Current Smoker   breath sounds clear to auscultation       Cardiovascular hypertension, (-) angina + CAD  (-) CHF and (-) DVT + Valvular Problems/Murmurs  Rhythm:Regular Rate:Normal    1. Left ventricular ejection fraction, by estimation, is 55 to 60%. The  left ventricle has normal function. The left ventricle has no regional  wall motion abnormalities. Left ventricular diastolic parameters are  indeterminate.   2. Right ventricular systolic function is normal. The right ventricular  size is normal. Tricuspid regurgitation signal is inadequate for assessing  PA pressure.   3. The mitral valve is grossly normal. Trivial mitral valve  regurgitation.   4. The aortic valve is tricuspid. Aortic valve regurgitation is not  visualized.   5. The inferior vena cava is normal in size with greater than 50%  respiratory variability, suggesting right atrial pressure of 3 mmHg.     Neuro/Psych neg Seizures PSYCHIATRIC DISORDERS Anxiety Depression     Neuromuscular disease    GI/Hepatic ,GERD  ,,(+)     substance abuse  cocaine use  Endo/Other  diabetes, Type 2    Renal/GU      Musculoskeletal  (+) Arthritis , Osteoarthritis,    Abdominal   Peds  Hematology   Anesthesia Other Findings   Reproductive/Obstetrics                              Anesthesia Physical Anesthesia Plan  ASA: 2  Anesthesia Plan: General   Post-op Pain Management:    Induction:  Intravenous  PONV Risk Score and Plan: 2 and Ondansetron and Dexamethasone  Airway Management Planned: Oral ETT  Additional Equipment:   Intra-op Plan:   Post-operative Plan: Extubation in OR  Informed Consent: I have reviewed the patients History and Physical, chart, labs and discussed the procedure including the risks, benefits and alternatives for the proposed anesthesia with the patient or authorized representative who has indicated his/her understanding and acceptance.     Dental advisory given  Plan Discussed with: CRNA  Anesthesia Plan Comments: (PAT note written 05/29/2023 by Shonna Chock, PA-C.  )        Anesthesia Quick Evaluation

## 2023-05-29 NOTE — Progress Notes (Signed)
 PCP - Eliezer Lofts, MD  Cardiologist -   PPM/ICD - denies Device Orders - n/a Rep Notified - n/a  Chest x-ray -  EKG - DOS Stress Test -  ECHO - 01-22-21 Cardiac Cath -   CPAP - denies    Fasting Blood Sugar - Per patient does not check blood sugar MD monitors lab work Checks Blood Sugar  monitor by lab work  Blood Thinner Instructions: denies Aspirin Instructions: instructed patient to call surgeon for further instructions  ERAS Protcol - NPO  COVID TEST- n/a  Anesthesia review: yes  Patient verbally denies any shortness of breath, fever, cough and chest pain during phone call   -------------  SDW INSTRUCTIONS given:  Your procedure is scheduled on May 30, 2023.  Report to [facility] Main Entrance "A" at 9:05 A.M., and check in at the Admitting office.  Call this number if you have problems the morning of surgery:  [phone]   Remember:  Do not eat or drink after midnight the night before your surgery      Take these medicines the morning of surgery with A SIP OF WATER  acetaminophen (TYLENOL)  citalopram (CELEXA)  fenofibrate (TRICOR)  fluticasone (FLONASE)  methocarbamol (ROBAXIN)  omeprazole (PRILOSEC)  ondansetron (ZOFRAN)  oxyCODONE-acetaminophen (PERCOCET)  pravastatin (PRAVACHOL   As of today, STOP taking any Aspirin (unless otherwise instructed by your surgeon) Aleve, Naproxen, Ibuprofen, Motrin, Advil, Goody's, BC's, all herbal medications, fish oil, and all vitamins. WHAT DO I DO ABOUT MY DIABETES MEDICATION?   Do not take oral diabetes medicines metFORMIN (GLUCOPHAGE) and  JARDIANCE   the morning of surgery.   The day of surgery, do not take other diabetes injectables, including Byetta (exenatide), Bydureon (exenatide ER), Victoza (liraglutide), or Trulicity (dulaglutide).  If your CBG is greater than 220 mg/dL, you may take  of your sliding scale (correction) dose of insulin.   HOW TO MANAGE YOUR DIABETES BEFORE AND AFTER  SURGERY  Why is it important to control my blood sugar before and after surgery? Improving blood sugar levels before and after surgery helps healing and can limit problems. A way of improving blood sugar control is eating a healthy diet by:  Eating less sugar and carbohydrates  Increasing activity/exercise  Talking with your doctor about reaching your blood sugar goals High blood sugars (greater than 180 mg/dL) can raise your risk of infections and slow your recovery, so you will need to focus on controlling your diabetes during the weeks before surgery. Make sure that the doctor who takes care of your diabetes knows about your planned surgery including the date and location.  How do I manage my blood sugar before surgery? Check your blood sugar at least 4 times a day, starting 2 days before surgery, to make sure that the level is not too high or low.  Check your blood sugar the morning of your surgery when you wake up and every 2 hours until you get to the Short Stay unit.  If your blood sugar is less than 70 mg/dL, you will need to treat for low blood sugar: Do not take insulin. Treat a low blood sugar (less than 70 mg/dL) with  cup of clear juice (cranberry or apple), 4 glucose tablets, OR glucose gel. Recheck blood sugar in 15 minutes after treatment (to make sure it is greater than 70 mg/dL). If your blood sugar is not greater than 70 mg/dL on recheck, call [phone] for further instructions. Report your blood sugar to the  short stay nurse when you get to Short Stay.  If you are admitted to the hospital after surgery: Your blood sugar will be checked by the staff and you will probably be given insulin after surgery (instead of oral diabetes medicines) to make sure you have good blood sugar levels. The goal for blood sugar control after surgery is 80-180 mg/dL.                      Do not wear jewelry, make up, or nail polish            Do not wear lotions, powders,  perfumes/colognes, or deodorant.            Do not shave 48 hours prior to surgery.  Men may shave face and neck.            Do not bring valuables to the hospital.            [facility] is not responsible for any belongings or valuables.  Do NOT Smoke (Tobacco/Vaping) 24 hours prior to your procedure If you use a CPAP at night, you may bring all equipment for your overnight stay.   Contacts, glasses, dentures or bridgework may not be worn into surgery.      For patients admitted to the hospital, discharge time will be determined by your treatment team.   Patients discharged the day of surgery will not be allowed to drive home, and someone needs to stay with them for 24 hours.    Special instructions:   - Preparing For Surgery  Before surgery, you can play an important role. Because skin is not sterile, your skin needs to be as free of germs as possible. You can reduce the number of germs on your skin by washing with CHG (chlorahexidine gluconate) Soap before surgery.  CHG is an antiseptic cleaner which kills germs and bonds with the skin to continue killing germs even after washing.    Oral Hygiene is also important to reduce your risk of infection.  Remember - BRUSH YOUR TEETH THE MORNING OF SURGERY WITH YOUR REGULAR TOOTHPASTE  Please do not use if you have an allergy to CHG or antibacterial soaps. If your skin becomes reddened/irritated stop using the CHG.  Do not shave (including legs and underarms) for at least 48 hours prior to first CHG shower. It is OK to shave your face.  Please follow these instructions carefully.   Shower the NIGHT BEFORE SURGERY and the MORNING OF SURGERY with DIAL Soap.   Pat yourself dry with a CLEAN TOWEL.  Wear CLEAN PAJAMAS to bed the night before surgery  Place CLEAN SHEETS on your bed the night of your first shower and DO NOT SLEEP WITH PETS.   Day of Surgery: Please shower morning of surgery  Wear Clean/Comfortable clothing the  morning of surgery Do not apply any deodorants/lotions.   Remember to brush your teeth WITH YOUR REGULAR TOOTHPASTE.   Questions were answered. Patient verbalized understanding of instructions.

## 2023-05-29 NOTE — Progress Notes (Signed)
 Anesthesia Chart Review: SAME DAY WORK-UP  Case: [id number] Date/Time: 05/30/23 1123   Procedures:      OPEN REDUCTION INTERNAL FIXATION (ORIF) ANKLE FRACTURE (Left: Ankle)     REPAIR, SYNDESMOSIS, ANKLE (Left)   Anesthesia type: General   Diagnosis: Closed displaced fracture of lateral malleolus of left fibula, initial encounter [S82.62XA]   Pre-op diagnosis: DISTAL FIBULA FRACTURE LEFT ANKLE   Location: [facility] ROOM 03 / [facility]   Surgeons: Myrene Galas, MD       DISCUSSION: Patient is a 58 year old female scheduled for the above procedure. She fell on 05/07/23 and sustained a left ankle fracture.  History includes smoking, HTN, CAD (mild RCA 2022 CCTA), DM2, peripheral neuropathy. + Cocaine UDS 4/20233.   She had cardiology evaluation by Dr. Armanda Magic on 12/10/20 for murmur, chronic exertional dyspnea, chest pain. She had a CCTA on 12/21/20 that showed and elevated CAC score of 7.1 (82nd percentile) but only mild CAD with soft plaque < 50% in the mid RCA.  Non cardiac portion showed mucosal thickening in the distal esophagus and fatty liver and referral to GI Dr. Marca Ancona recommended. Continue ASA and statin.   PAT RN phone interview still pending and will verify last Jardiance dose once RN is able to reach her.  She has a same-day work, so anesthesia team to evaluate on the day of surgery.  Labs and EKG on arrival as indicated.   VS: 05/07/23: BP 11/63, HR 80, WT 89.4 KG.   PROVIDERS: Eliezer Lofts, MD is PCP    LABS: For day of surgery as indicated. Cr 0.58, H/H 14.4/40.3, PLT 262, glucose 193 and UDS + cocaine on 06/13/22 (see [facility] Everywhere).    IMAGES: Xray left ankle 05/07/23: IMPRESSION: 1. Acute nondisplaced oblique fracture of the distal fibular diaphysis and metaphysis. 2. There is a 3 mm ossicle overlying the anterior superior border of the talar dome on lateral view, age indeterminate.    EKG: For day of surgery as indicated.  EKG 06/13/22 [facility]): Per Narrative  in Care Everywhere: Normal sinus rhythm  Prolonged QT (QTC Fredericia 474 ms) Abnormal ECG  When compared with ECG of 06-Jun-2021 06:02,  No significant change was found  Confirmed by Gwenyth Bouillon ([id number]) on 06/21/2022 2:04:13 PM   EKG 12/10/20: NSR   CV: Echo 01/22/21: IMPRESSIONS   1. Left ventricular ejection fraction, by estimation, is 55 to 60%. The  left ventricle has normal function. The left ventricle has no regional  wall motion abnormalities. Left ventricular diastolic parameters are  indeterminate.   2. Right ventricular systolic function is normal. The right ventricular  size is normal. Tricuspid regurgitation signal is inadequate for assessing  PA pressure.   3. The mitral valve is grossly normal. Trivial mitral valve  regurgitation.   4. The aortic valve is tricuspid. Aortic valve regurgitation is not  visualized.   5. The inferior vena cava is normal in size with greater than 50%  respiratory variability, suggesting right atrial pressure of 3 mmHg.  - Comparison(s): No prior Echocardiogram.    CT Coronary 12/21/20: FINDINGS: - No LA appendage thrombus. Pulmonary veins drain normally to the left atrium. - Calcium Score: 7.1 Agatston units. - Coronary Arteries: Right dominant with no anomalies LM: No plaque or stenosis. LAD system: No plaque or stenosis. Circumflex system: No plaque or stenosis. RCA system: Primarily soft plaque in the mid RCA with mild (<50%)stenosis.   IMPRESSION: 1. Coronary artery calcium score 7.1 Agatston units. This places  the patient in the 82nd percentile for age and gender, suggesting high risk for future cardiac events. 2.  Mild stenosis mid RCA, soft plaque primarily.   Past Medical History:  Diagnosis Date   Anxiety    CAD (coronary artery disease), native coronary artery    coronary Ca score of 7.1 and mild CAD  of the RCA with soft plaque < 50% in the mid RCA   Carpal tunnel syndrome    Depression    Hypertension      Past Surgical History:  Procedure Laterality Date   BALLOON DILATION N/A 04/22/2021   Procedure: BALLOON DILATION;  Surgeon: [name], DO;  Location: [facility] ENDO SUITE;  Service: Endoscopy;  Laterality: N/A;   BIOPSY  04/22/2021   Procedure: BIOPSY;  Surgeon: [name], DO;  Location: [facility] END[facility] SUITE;  Service: Endoscopy;;   COLONOSCOPY WITH PROPOFOL N/A 04/22/2021   Surgeon: [name], DO; nonbleeding internal hemorrhoids.  Repeat in 10 years.   ESOPHAGOGASTRODUODENOSCOPY (EGD) WITH PROPOFOL N/A 04/22/2021   Surgeon: [name], DO;  Normal esophagus s/p empiric dilation, erythematous mucosa in the gastric body and antrum biopsied, normal examined duodenum.   TUBAL LIGATION      MEDICATIONS: No current facility-administered medications for this encounter.    acetaminophen (TYLENOL) 650 MG CR tablet   aspirin 81 MG chewable tablet   citalopram (CELEXA) 20 MG tablet   fenofibrate (TRICOR) 145 MG tablet   fluticasone (FLONASE) 50 MCG/ACT nasal spray   gabapentin (NEURONTIN) 300 MG capsule   JARDIANCE 10 MG TABS tablet   lisinopril (ZESTRIL) 10 MG tablet   metFORMIN (GLUCOPHAGE) 1000 MG tablet   methocarbamol (ROBAXIN) 500 MG tablet   Multiple Vitamin (MULTIVITAMIN WITH MINERALS) TABS tablet   omeprazole (PRILOSEC) 40 MG capsule   ondansetron (ZOFRAN) 4 MG tablet   oxyCODONE-acetaminophen (PERCOCET/ROXICET) 5-325 MG tablet   pravastatin (PRAVACHOL) 10 MG tablet   traZODone (DESYREL) 100 MG tablet   oxyCODONE-acetaminophen (PERCOCET) 7.5-325 MG tablet    Shonna Chock, PA-C Surgical Short Stay/Anesthesiology [facility] Phone [phone] [facility] Phone [phone] 05/29/2023 11:25 AM

## 2023-05-30 ENCOUNTER — Ambulatory Visit (HOSPITAL_COMMUNITY)
Admission: RE | Admit: 2023-05-30 | Discharge: 2023-05-30 | Disposition: A | Discharge status: Home / Self Care | Payer: MEDICAID | Attending: Orthopedic Surgery | Admitting: Orthopedic Surgery

## 2023-05-30 ENCOUNTER — Other Ambulatory Visit: Payer: Self-pay

## 2023-05-30 ENCOUNTER — Encounter (HOSPITAL_COMMUNITY)
Admission: RE | Disposition: A | Discharge status: Home / Self Care | Payer: Self-pay | Source: Home / Self Care | Attending: Orthopedic Surgery

## 2023-05-30 ENCOUNTER — Other Ambulatory Visit (HOSPITAL_COMMUNITY): Payer: Self-pay

## 2023-05-30 ENCOUNTER — Ambulatory Visit (HOSPITAL_BASED_OUTPATIENT_CLINIC_OR_DEPARTMENT_OTHER): Payer: MEDICAID | Admitting: Vascular Surgery

## 2023-05-30 ENCOUNTER — Ambulatory Visit (HOSPITAL_COMMUNITY): Payer: MEDICAID

## 2023-05-30 ENCOUNTER — Ambulatory Visit (HOSPITAL_COMMUNITY): Payer: MEDICAID | Admitting: Vascular Surgery

## 2023-05-30 ENCOUNTER — Encounter (HOSPITAL_COMMUNITY): Payer: Self-pay | Admitting: Orthopedic Surgery

## 2023-05-30 DIAGNOSIS — M898X9 Other specified disorders of bone, unspecified site: Secondary | ICD-10-CM | POA: Insufficient documentation

## 2023-05-30 DIAGNOSIS — M1990 Unspecified osteoarthritis, unspecified site: Secondary | ICD-10-CM | POA: Insufficient documentation

## 2023-05-30 DIAGNOSIS — E1142 Type 2 diabetes mellitus with diabetic polyneuropathy: Secondary | ICD-10-CM | POA: Diagnosis not present

## 2023-05-30 DIAGNOSIS — I2510 Atherosclerotic heart disease of native coronary artery without angina pectoris: Secondary | ICD-10-CM | POA: Diagnosis not present

## 2023-05-30 DIAGNOSIS — W1830XD Fall on same level, unspecified, subsequent encounter: Secondary | ICD-10-CM | POA: Insufficient documentation

## 2023-05-30 DIAGNOSIS — S8262XD Displaced fracture of lateral malleolus of left fibula, subsequent encounter for closed fracture with routine healing: Secondary | ICD-10-CM | POA: Diagnosis not present

## 2023-05-30 DIAGNOSIS — Z7984 Long term (current) use of oral hypoglycemic drugs: Secondary | ICD-10-CM | POA: Insufficient documentation

## 2023-05-30 DIAGNOSIS — Z880 Allergy status to penicillin: Secondary | ICD-10-CM | POA: Diagnosis not present

## 2023-05-30 DIAGNOSIS — S82892A Other fracture of left lower leg, initial encounter for closed fracture: Secondary | ICD-10-CM

## 2023-05-30 DIAGNOSIS — I10 Essential (primary) hypertension: Secondary | ICD-10-CM | POA: Insufficient documentation

## 2023-05-30 DIAGNOSIS — S8262XA Displaced fracture of lateral malleolus of left fibula, initial encounter for closed fracture: Secondary | ICD-10-CM

## 2023-05-30 DIAGNOSIS — F17210 Nicotine dependence, cigarettes, uncomplicated: Secondary | ICD-10-CM | POA: Diagnosis not present

## 2023-05-30 HISTORY — DX: Cardiac murmur, unspecified: R01.1

## 2023-05-30 HISTORY — PX: ORIF ANKLE FRACTURE: SHX5408

## 2023-05-30 HISTORY — PX: SYNDESMOSIS REPAIR: SHX5182

## 2023-05-30 HISTORY — DX: Unspecified osteoarthritis, unspecified site: M19.90

## 2023-05-30 HISTORY — DX: Type 2 diabetes mellitus without complications: E11.9

## 2023-05-30 HISTORY — DX: Fatty (change of) liver, not elsewhere classified: K76.0

## 2023-05-30 LAB — CBC WITH DIFFERENTIAL/PLATELET
Abs Immature Granulocytes: 0.04 10*3/uL (ref 0.00–0.07)
Basophils Absolute: 0.1 10*3/uL (ref 0.0–0.1)
Basophils Relative: 1 %
Eosinophils Absolute: 0.2 10*3/uL (ref 0.0–0.5)
Eosinophils Relative: 2 %
HCT: 45.5 % (ref 36.0–46.0)
Hemoglobin: 14.9 g/dL (ref 12.0–15.0)
Immature Granulocytes: 1 %
Lymphocytes Relative: 41 %
Lymphs Abs: 3.5 10*3/uL (ref 0.7–4.0)
MCH: 30.6 pg (ref 26.0–34.0)
MCHC: 32.7 g/dL (ref 30.0–36.0)
MCV: 93.4 fL (ref 80.0–100.0)
Monocytes Absolute: 0.5 10*3/uL (ref 0.1–1.0)
Monocytes Relative: 6 %
Neutro Abs: 4.1 10*3/uL (ref 1.7–7.7)
Neutrophils Relative %: 49 %
Platelets: 285 10*3/uL (ref 150–400)
RBC: 4.87 MIL/uL (ref 3.87–5.11)
RDW: 12.7 % (ref 11.5–15.5)
WBC: 8.4 10*3/uL (ref 4.0–10.5)
nRBC: 0 % (ref 0.0–0.2)

## 2023-05-30 LAB — SURGICAL PCR SCREEN
MRSA, PCR: NEGATIVE
Staphylococcus aureus: POSITIVE — AB

## 2023-05-30 LAB — COMPREHENSIVE METABOLIC PANEL WITH GFR
ALT: 16 U/L (ref 0–44)
AST: 19 U/L (ref 15–41)
Albumin: 3.9 g/dL (ref 3.5–5.0)
Alkaline Phosphatase: 45 U/L (ref 38–126)
Anion gap: 13 (ref 5–15)
BUN: 19 mg/dL (ref 6–20)
CO2: 19 mmol/L — ABNORMAL LOW (ref 22–32)
Calcium: 9.5 mg/dL (ref 8.9–10.3)
Chloride: 103 mmol/L (ref 98–111)
Creatinine, Ser: 0.68 mg/dL (ref 0.44–1.00)
GFR, Estimated: >60 mL/min (ref >60)
Glucose, Bld: 140 mg/dL — ABNORMAL HIGH (ref 70–99)
Potassium: 4 mmol/L (ref 3.5–5.1)
Sodium: 135 mmol/L (ref 135–145)
Total Bilirubin: 0.6 mg/dL (ref 0.0–1.2)
Total Protein: 7.3 g/dL (ref 6.5–8.1)

## 2023-05-30 LAB — VITAMIN D 25 HYDROXY (VIT D DEFICIENCY, FRACTURES): Vit D, 25-Hydroxy: 73.01 ng/mL (ref 30–100)

## 2023-05-30 LAB — GLUCOSE, CAPILLARY
Glucose-Capillary: 141 mg/dL — ABNORMAL HIGH (ref 70–99)
Glucose-Capillary: 144 mg/dL — ABNORMAL HIGH (ref 70–99)
Glucose-Capillary: 144 mg/dL — ABNORMAL HIGH (ref 70–99)

## 2023-05-30 SURGERY — OPEN REDUCTION INTERNAL FIXATION (ORIF) ANKLE FRACTURE
Anesthesia: Regional | Site: Ankle | Laterality: Left

## 2023-05-30 MED ORDER — ACETAMINOPHEN 10 MG/ML IV SOLN: 1000.0000 mg | Freq: Once | INTRAVENOUS | Status: DC | PRN

## 2023-05-30 MED ORDER — VANCOMYCIN HCL IN DEXTROSE 1-5 GM/200ML-% IV SOLN
INTRAVENOUS | Status: DC
Start: 2023-05-30 — End: 2023-05-30
  Filled 2023-05-30: qty 200

## 2023-05-30 MED ORDER — ORAL CARE MOUTH RINSE: 15.0000 mL | Freq: Once | OROMUCOSAL | Status: AC

## 2023-05-30 MED ORDER — ROCURONIUM BROMIDE 10 MG/ML (PF) SYRINGE
PREFILLED_SYRINGE | INTRAVENOUS | Status: DC | PRN
  Administered 2023-05-30: 60 mg via INTRAVENOUS

## 2023-05-30 MED ORDER — PROPOFOL 10 MG/ML IV BOLUS
INTRAVENOUS | Status: DC | PRN
  Administered 2023-05-30: 120 mg via INTRAVENOUS

## 2023-05-30 MED ORDER — OXYCODONE HCL 5 MG PO TABS
5.0000 mg | ORAL_TABLET | Freq: Four times a day (QID) | ORAL | 0 refills | Status: AC | PRN
  Filled 2023-05-30: qty 20, 5d supply, fill #0

## 2023-05-30 MED ORDER — ROCURONIUM BROMIDE 10 MG/ML (PF) SYRINGE
PREFILLED_SYRINGE | INTRAVENOUS | Status: AC
  Filled 2023-05-30: qty 10

## 2023-05-30 MED ORDER — MIDAZOLAM HCL 2 MG/2ML IJ SOLN
INTRAMUSCULAR | Status: AC
Start: 2023-05-30 — End: 2023-05-30
  Administered 2023-05-30: 2 mg via INTRAVENOUS
  Filled 2023-05-30: qty 2

## 2023-05-30 MED ORDER — FENTANYL CITRATE (PF) 250 MCG/5ML IJ SOLN
INTRAMUSCULAR | Status: DC | PRN
Start: 2023-05-30 — End: 2023-05-30
  Administered 2023-05-30: 100 ug via INTRAVENOUS

## 2023-05-30 MED ORDER — EPHEDRINE 5 MG/ML INJ
INTRAVENOUS | Status: AC
  Filled 2023-05-30: qty 5

## 2023-05-30 MED ORDER — LIDOCAINE 2% (20 MG/ML) 5 ML SYRINGE
INTRAMUSCULAR | Status: AC
  Filled 2023-05-30: qty 5

## 2023-05-30 MED ORDER — FENTANYL CITRATE (PF) 100 MCG/2ML IJ SOLN
INTRAMUSCULAR | Status: AC
Start: 2023-05-30 — End: 2023-05-30
  Administered 2023-05-30: 50 ug via INTRAVENOUS
  Filled 2023-05-30: qty 2

## 2023-05-30 MED ORDER — FENTANYL CITRATE (PF) 100 MCG/2ML IJ SOLN: 50.0000 ug | Freq: Once | INTRAMUSCULAR | Status: AC

## 2023-05-30 MED ORDER — CEFAZOLIN SODIUM-DEXTROSE 2-4 GM/100ML-% IV SOLN
2.0000 g | INTRAVENOUS | Status: AC
  Administered 2023-05-30: 20 mg via INTRAVENOUS
  Administered 2023-05-30: 1.98 g via INTRAVENOUS
  Filled 2023-05-30: qty 100

## 2023-05-30 MED ORDER — FENTANYL CITRATE (PF) 250 MCG/5ML IJ SOLN
INTRAMUSCULAR | Status: AC
  Filled 2023-05-30: qty 5

## 2023-05-30 MED ORDER — PHENYLEPHRINE HCL-NACL 20-0.9 MG/250ML-% IV SOLN
INTRAVENOUS | Status: DC | PRN
  Administered 2023-05-30: 20 ug/min via INTRAVENOUS

## 2023-05-30 MED ORDER — OXYCODONE HCL 5 MG/5ML PO SOLN: 5.0000 mg | Freq: Once | ORAL | Status: DC | PRN

## 2023-05-30 MED ORDER — FENTANYL CITRATE (PF) 100 MCG/2ML IJ SOLN: 25.0000 ug | INTRAMUSCULAR | Status: DC | PRN

## 2023-05-30 MED ORDER — LACTATED RINGERS IV SOLN: INTRAVENOUS | Status: DC

## 2023-05-30 MED ORDER — INSULIN ASPART 100 UNIT/ML IJ SOLN
INTRAMUSCULAR | Status: AC
  Filled 2023-05-30: qty 1

## 2023-05-30 MED ORDER — INSULIN ASPART 100 UNIT/ML IJ SOLN
0.0000 [IU] | INTRAMUSCULAR | Status: DC | PRN
Start: 2023-05-30 — End: 2023-05-30
  Administered 2023-05-30: 2 [IU] via SUBCUTANEOUS

## 2023-05-30 MED ORDER — ONDANSETRON HCL 4 MG/2ML IJ SOLN
4.0000 mg | Freq: Once | INTRAMUSCULAR | Status: AC | PRN
  Administered 2023-05-30: 4 mg via INTRAVENOUS

## 2023-05-30 MED ORDER — 0.9 % SODIUM CHLORIDE (POUR BTL) OPTIME
TOPICAL | Status: DC | PRN
  Administered 2023-05-30: 1000 mL

## 2023-05-30 MED ORDER — EPHEDRINE SULFATE-NACL 50-0.9 MG/10ML-% IV SOSY
PREFILLED_SYRINGE | INTRAVENOUS | Status: DC | PRN
Start: 2023-05-30 — End: 2023-05-30
  Administered 2023-05-30 (×2): 10 mg via INTRAVENOUS

## 2023-05-30 MED ORDER — ONDANSETRON HCL 4 MG/2ML IJ SOLN
INTRAMUSCULAR | Status: DC | PRN
  Administered 2023-05-30: 4 mg via INTRAVENOUS

## 2023-05-30 MED ORDER — OXYCODONE HCL 5 MG PO TABS: 5.0000 mg | ORAL_TABLET | Freq: Once | ORAL | Status: DC | PRN

## 2023-05-30 MED ORDER — MIDAZOLAM HCL 2 MG/2ML IJ SOLN
2.0000 mg | Freq: Once | INTRAMUSCULAR | Status: AC
Start: 2023-05-30 — End: 2023-05-30

## 2023-05-30 MED ORDER — PROPOFOL 10 MG/ML IV BOLUS
INTRAVENOUS | Status: AC
  Filled 2023-05-30: qty 20

## 2023-05-30 MED ORDER — SUGAMMADEX SODIUM 200 MG/2ML IV SOLN
INTRAVENOUS | Status: DC | PRN
  Administered 2023-05-30: 200 mg via INTRAVENOUS

## 2023-05-30 MED ORDER — BUPIVACAINE-EPINEPHRINE (PF) 0.5% -1:200000 IJ SOLN
INTRAMUSCULAR | Status: DC | PRN
Start: 2023-05-30 — End: 2023-05-30
  Administered 2023-05-30: 30 mL

## 2023-05-30 MED ORDER — LIDOCAINE 2% (20 MG/ML) 5 ML SYRINGE
INTRAMUSCULAR | Status: DC | PRN
  Administered 2023-05-30: 20 mg via INTRAVENOUS

## 2023-05-30 MED ORDER — ONDANSETRON HCL 4 MG/2ML IJ SOLN
INTRAMUSCULAR | Status: AC
  Filled 2023-05-30: qty 2

## 2023-05-30 MED ORDER — DEXAMETHASONE SODIUM PHOSPHATE 10 MG/ML IJ SOLN
INTRAMUSCULAR | Status: DC | PRN
  Administered 2023-05-30: 4 mg via INTRAVENOUS

## 2023-05-30 MED ORDER — CHLORHEXIDINE GLUCONATE 0.12 % MT SOLN
15.0000 mL | Freq: Once | OROMUCOSAL | Status: AC
  Administered 2023-05-30: 15 mL via OROMUCOSAL
  Filled 2023-05-30: qty 15

## 2023-05-30 SURGICAL SUPPLY — 70 items
BAG COUNTER SPONGE SURGICOUNT (BAG) ×2 IMPLANT
BANDAGE ESMARK 6X9 LF (GAUZE/BANDAGES/DRESSINGS) ×2 IMPLANT
BIT DRILL SHORT 2.0 ZI (BIT) IMPLANT
BIT DRILL SHORT 2.5 (BIT) IMPLANT
BIT DRL SHORT 2.5 (BIT) ×2 IMPLANT
BNDG ELASTIC 4INX 5YD STR LF (GAUZE/BANDAGES/DRESSINGS) IMPLANT
BNDG ELASTIC 4X5.8 VLCR STR LF (GAUZE/BANDAGES/DRESSINGS) IMPLANT
BNDG ELASTIC 6INX 5YD STR LF (GAUZE/BANDAGES/DRESSINGS) IMPLANT
BNDG ESMARK 6X9 LF (GAUZE/BANDAGES/DRESSINGS) ×2 IMPLANT
BNDG GAUZE DERMACEA FLUFF 4 (GAUZE/BANDAGES/DRESSINGS) ×4 IMPLANT
BRUSH SCRUB EZ PLAIN DRY (MISCELLANEOUS) ×4 IMPLANT
COVER MAYO STAND STRL (DRAPES) ×2 IMPLANT
COVER SURGICAL LIGHT HANDLE (MISCELLANEOUS) ×2 IMPLANT
CUFF TRNQT CYL 34X4.125X (TOURNIQUET CUFF) ×2 IMPLANT
DRAPE C-ARM 42X72 X-RAY (DRAPES) ×2 IMPLANT
DRAPE C-ARMOR (DRAPES) ×2 IMPLANT
DRAPE HALF SHEET 40X57 (DRAPES) ×2 IMPLANT
DRAPE U-SHAPE 47X51 STRL (DRAPES) ×2 IMPLANT
DRIVER RETENTION T15 LONG (ORTHOPEDIC DISPOSABLE SUPPLIES) IMPLANT
DRSG EMULSION OIL 3X3 NADH (GAUZE/BANDAGES/DRESSINGS) IMPLANT
DRSG MEPITEL 4X7.2 (GAUZE/BANDAGES/DRESSINGS) IMPLANT
ELECT REM PT RETURN 9FT ADLT (ELECTROSURGICAL) ×2 IMPLANT
ELECTRODE REM PT RTRN 9FT ADLT (ELECTROSURGICAL) ×2 IMPLANT
FIXATION ZIPTIGHT ANKLE SNDSMS (Ankle) IMPLANT
GAUZE SPONGE 4X4 12PLY STRL (GAUZE/BANDAGES/DRESSINGS) ×4 IMPLANT
GAUZE SPONGE 4X4 12PLY STRL LF (GAUZE/BANDAGES/DRESSINGS) IMPLANT
GLOVE BIO SURGEON STRL SZ7.5 (GLOVE) ×2 IMPLANT
GLOVE BIO SURGEON STRL SZ8 (GLOVE) ×2 IMPLANT
GLOVE BIOGEL PI IND STRL 7.5 (GLOVE) ×2 IMPLANT
GLOVE BIOGEL PI IND STRL 8 (GLOVE) ×2 IMPLANT
GLOVE SURG ORTHO LTX SZ7.5 (GLOVE) ×4 IMPLANT
GOWN STRL REUS W/ TWL LRG LVL3 (GOWN DISPOSABLE) ×4 IMPLANT
GOWN STRL REUS W/ TWL XL LVL3 (GOWN DISPOSABLE) ×2 IMPLANT
K-WIRE ALPS MXV 1.6X6 ZI (WIRE) ×4 IMPLANT
KIT BASIN OR (CUSTOM PROCEDURE TRAY) ×2 IMPLANT
KIT TURNOVER KIT B (KITS) ×2 IMPLANT
KWIRE ALPS MXV 1.6X6 ZI (WIRE) IMPLANT
MANIFOLD NEPTUNE II (INSTRUMENTS) ×2 IMPLANT
NDL HYPO 21X1.5 SAFETY (NEEDLE) IMPLANT
NEEDLE HYPO 21X1.5 SAFETY (NEEDLE) IMPLANT
NS IRRIG 1000ML POUR BTL (IV SOLUTION) ×2 IMPLANT
PACK GENERAL/GYN (CUSTOM PROCEDURE TRAY) ×2 IMPLANT
PACK ORTHO EXTREMITY (CUSTOM PROCEDURE TRAY) ×2 IMPLANT
PAD ARMBOARD POSITIONER FOAM (MISCELLANEOUS) ×4 IMPLANT
PAD CAST 4YDX4 CTTN HI CHSV (CAST SUPPLIES) IMPLANT
PADDING CAST ABS COTTON 4X4 ST (CAST SUPPLIES) IMPLANT
PADDING CAST COTTON 6X4 STRL (CAST SUPPLIES) IMPLANT
PLATE LAT FIB 4H LT (Plate) IMPLANT
SCREW LOCK 3.5X10 (Screw) IMPLANT
SCREW LOCK MDS 2.710 (Screw) IMPLANT
SCREW LOCK MDS 2.7X14 (Screw) IMPLANT
SCREW LOCK MDS 2.7X16 (Screw) IMPLANT
SCREW NL3.5X50 (Screw) IMPLANT
SCREW NLOCK 2.7X18 (Screw) IMPLANT
SCREW NLOCK MVX 2.7X16 (Screw) IMPLANT
SCREW NON-LOCK 3.5X10 (Screw) IMPLANT
SCREW NON-LOCK 3.5X12 (Screw) IMPLANT
SPLINT PLASTER CAST XFAST 5X30 (CAST SUPPLIES) IMPLANT
SPONGE T-LAP 18X18 ~~LOC~~+RFID (SPONGE) ×2 IMPLANT
STAPLER VISISTAT 35W (STAPLE) IMPLANT
SUCTION TUBE FRAZIER 10FR DISP (SUCTIONS) ×2 IMPLANT
SUT ETHILON 2 0 FS 18 (SUTURE) ×4 IMPLANT
SUT PDS AB 2-0 CT1 27 (SUTURE) IMPLANT
SUT VIC AB 2-0 CT1 TAPERPNT 27 (SUTURE) ×4 IMPLANT
TOWEL GREEN STERILE (TOWEL DISPOSABLE) ×4 IMPLANT
TOWEL GREEN STERILE FF (TOWEL DISPOSABLE) ×2 IMPLANT
TUBE CONNECTING 12X1/4 (SUCTIONS) ×2 IMPLANT
UNDERPAD 30X36 HEAVY ABSORB (UNDERPADS AND DIAPERS) ×2 IMPLANT
WATER STERILE IRR 1000ML POUR (IV SOLUTION) ×2 IMPLANT
ZIPTIGHT ANKLE SYNODESMOSS FIX (Ankle) ×6 IMPLANT

## 2023-05-30 NOTE — Anesthesia Procedure Notes (Signed)
 Procedure Name: Intubation Date/Time: 05/30/2023 12:13 PM  Performed by: Gus Puma, CRNAPre-anesthesia Checklist: Patient identified, Emergency Drugs available, Suction available and Patient being monitored Patient Re-evaluated:Patient Re-evaluated prior to induction Oxygen Delivery Method: Circle System Utilized Preoxygenation: Pre-oxygenation with 100% oxygen Induction Type: IV induction Ventilation: Mask ventilation without difficulty Laryngoscope Size: Mac and 3 Grade View: Grade I Tube type: Oral Tube size: 7.0 mm Number of attempts: 1 Airway Equipment and Method: Stylet Placement Confirmation: ETT inserted through vocal cords under direct vision, positive ETCO2 and breath sounds checked- equal and bilateral Secured at: 21 cm Tube secured with: Tape Dental Injury: Teeth and Oropharynx as per pre-operative assessment

## 2023-05-30 NOTE — Anesthesia Procedure Notes (Signed)
 Anesthesia Regional Block: Popliteal block   Pre-Anesthetic Checklist: , timeout performed,  Correct Patient, Correct Site, Correct Laterality,  Correct Procedure, Correct Position, site marked,  Risks and benefits discussed,  Surgical consent,  Pre-op evaluation,  At surgeon's request and post-op pain management  Laterality: Left  Prep: Maximum Sterile Barrier Precautions used, chloraprep       Needles:  Injection technique: Single-shot  Needle Type: Echogenic Needle      Needle Gauge: 20     Additional Needles:   Procedures:,,,, ultrasound used (permanent image in chart),,    Narrative:  Start time: 05/30/2023 11:00 AM End time: 05/30/2023 11:03 AM Injection made incrementally with aspirations every 5 mL.  Performed by: Personally  Anesthesiologist: Mariann Barter, MD

## 2023-05-30 NOTE — Anesthesia Procedure Notes (Signed)
 Anesthesia Regional Block: Adductor canal block   Pre-Anesthetic Checklist: , timeout performed,  Correct Patient, Correct Site, Correct Laterality,  Correct Procedure, Correct Position, site marked,  Risks and benefits discussed,  Surgical consent,  Pre-op evaluation,  At surgeon's request and post-op pain management  Laterality: Left  Prep: Maximum Sterile Barrier Precautions used, chloraprep       Needles:  Injection technique: Single-shot  Needle Type: Echogenic Needle      Needle Gauge: 20     Additional Needles:   Procedures:,,,, ultrasound used (permanent image in chart),,    Narrative:  Start time: 05/30/2023 10:58 AM End time: 05/30/2023 11:00 AM Injection made incrementally with aspirations every 5 mL.  Performed by: Personally  Anesthesiologist: Mariann Barter, MD

## 2023-05-30 NOTE — Transfer of Care (Signed)
 Immediate Anesthesia Transfer of Care Note  Patient: Renee Reilly  Procedure(s) Performed: OPEN REDUCTION INTERNAL FIXATION (ORIF) ANKLE FRACTURE (Left: Ankle) REPAIR, SYNDESMOSIS, ANKLE (Left)  Patient Location: PACU  Anesthesia Type:General and Regional  Level of Consciousness: awake, drowsy, and patient cooperative  Airway & Oxygen Therapy: Patient Spontanous Breathing and Patient connected to nasal cannula oxygen  Post-op Assessment: Report given to RN and Post -op Vital signs reviewed and stable  Post vital signs: Reviewed and stable  Last Vitals:  Vitals Value Taken Time  BP 151/80 05/30/23 1349  Temp    Pulse 83 05/30/23 1354  Resp 16 05/30/23 1354  SpO2 97 % 05/30/23 1354  Vitals shown include unfiled device data.  Last Pain:  Vitals:   05/30/23 1057  TempSrc:   PainSc: 7       Patients Stated Pain Goal: 2 (05/30/23 0939)  Complications: No notable events documented.

## 2023-05-30 NOTE — Anesthesia Postprocedure Evaluation (Signed)
 Anesthesia Post Note  Patient: COURTNE LIGHTY  Procedure(s) Performed: OPEN REDUCTION INTERNAL FIXATION (ORIF) ANKLE FRACTURE (Left: Ankle) REPAIR, SYNDESMOSIS, ANKLE (Left)     Patient location during evaluation: PACU Anesthesia Type: Regional Level of consciousness: awake and alert, patient cooperative and oriented Pain management: pain level controlled Vital Signs Assessment: post-procedure vital signs reviewed and stable Respiratory status: spontaneous breathing, nonlabored ventilation and respiratory function stable Cardiovascular status: blood pressure returned to baseline and stable Postop Assessment: no apparent nausea or vomiting Anesthetic complications: no   No notable events documented.  Last Vitals:  Vitals:   05/30/23 1415 05/30/23 1430  BP: 130/67 119/72  Pulse: 69 69  Resp: 14 12  Temp:  36.7 C  SpO2: 94% 95%    Last Pain:  Vitals:   05/30/23 1430  TempSrc:   PainSc: 0-No pain                 Javyn Havlin,E. Laylani Pudwill

## 2023-05-30 NOTE — Discharge Instructions (Addendum)
 Orthopaedic Trauma Service Discharge Instructions   General Discharge Instructions  Orthopaedic Injuries:  Left ankle fracture treated with open reduction internal fixation using plate and screws including syndesmosis  WEIGHT BEARING STATUS: Nonweightbearing left leg until further notice  RANGE OF MOTION/ACTIVITY: No ankle motion as you are splinted.  Okay to move toes and knees.  Activity as tolerated while maintaining weightbearing restrictions  Bone health:   Review the following resource for additional information regarding bone health  [email or web address]  Wound Care: Do not remove splint and do not get splint wet.  No other formal wound care  DVT/PE prophylaxis: Resume home aspirin  Diet: as you were eating previously.  Can use over the counter stool softeners and bowel preparations, such as Miralax, to help with bowel movements.  Narcotics can be constipating.  Be sure to drink plenty of fluids  PAIN MEDICATION USE AND EXPECTATIONS  You have likely been given narcotic medications to help control your pain.  After a traumatic event that results in an fracture (broken bone) with or without surgery, it is ok to use narcotic pain medications to help control one's pain.  We understand that everyone responds to pain differently and each individual patient will be evaluated on a regular basis for the continued need for narcotic medications. Ideally, narcotic medication use should last no more than 6-8 weeks (coinciding with fracture healing).   As a patient it is your responsibility as well to monitor narcotic medication use and report the amount and frequency you use these medications when you come to your office visit.   We would also advise that if you are using narcotic medications, you should take a dose prior to therapy to maximize you participation.  IF YOU ARE ON NARCOTIC MEDICATIONS IT IS NOT PERMISSIBLE TO OPERATE A MOTOR VEHICLE  (MOTORCYCLE/CAR/TRUCK/MOPED) OR HEAVY MACHINERY DO NOT MIX NARCOTICS WITH OTHER CNS (CENTRAL NERVOUS SYSTEM) DEPRESSANTS SUCH AS ALCOHOL   POST-OPERATIVE OPIOID TAPER INSTRUCTIONS: It is important to wean off of your opioid medication as soon as possible. If you do not need pain medication after your surgery it is ok to stop day one. Opioids include: Codeine, Hydrocodone(Norco, Vicodin), Oxycodone(Percocet, oxycontin) and hydromorphone amongst others.  Long term and even short term use of opiods can cause: Increased pain response Dependence Constipation Depression Respiratory depression And more.  Withdrawal symptoms can include Flu like symptoms Nausea, vomiting And more Techniques to manage these symptoms Hydrate well Eat regular healthy meals Stay active Use relaxation techniques(deep breathing, meditating, yoga) Do Not substitute Alcohol to help with tapering If you have been on opioids for less than two weeks and do not have pain than it is ok to stop all together.  Plan to wean off of opioids This plan should start within one week post op of your fracture surgery  Maintain the same interval or time between taking each dose and first decrease the dose.  Cut the total daily intake of opioids by one tablet each day Next start to increase the time between doses. The last dose that should be eliminated is the evening dose.    STOP SMOKING OR USING NICOTINE PRODUCTS!!!!  As discussed nicotine severely impairs your body's ability to heal surgical and traumatic wounds but also impairs bone healing.  Wounds and bone heal by forming microscopic blood vessels (angiogenesis) and nicotine is a vasoconstrictor (essentially, shrinks blood vessels).  Therefore, if vasoconstriction occurs to these microscopic blood vessels they essentially disappear and are unable to deliver necessary nutrients  to the healing tissue.  This is one modifiable factor that you can do to dramatically increase your  chances of healing your injury.    (This means no smoking, no nicotine gum, patches, etc)  DO NOT USE NONSTEROIDAL ANTI-INFLAMMATORY DRUGS (NSAID'S)  Using products such as Advil (ibuprofen), Aleve (naproxen), Motrin (ibuprofen) for additional pain control during fracture healing can delay and/or prevent the healing response.  If you would like to take over the counter (OTC) medication, Tylenol (acetaminophen) is ok.  However, some narcotic medications that are given for pain control contain acetaminophen as well. Therefore, you should not exceed more than 4000 mg of tylenol in a day if you do not have liver disease.  Also note that there are may OTC medicines, such as cold medicines and allergy medicines that my contain tylenol as well.  If you have any questions about medications and/or interactions please ask your doctor/PA or your pharmacist.      ICE AND ELEVATE INJURED/OPERATIVE EXTREMITY  Using ice and elevating the injured extremity above your heart can help with swelling and pain control.  Icing in a pulsatile fashion, such as 20 minutes on and 20 minutes off, can be followed.    Do not place ice directly on skin. Make sure there is a barrier between to skin and the ice pack.    Using frozen items such as frozen peas works well as the conform nicely to the are that needs to be iced.  USE AN ACE WRAP OR TED HOSE FOR SWELLING CONTROL  In addition to icing and elevation, Ace wraps or TED hose are used to help limit and resolve swelling.  It is recommended to use Ace wraps or TED hose until you are informed to stop.    When using Ace Wraps start the wrapping distally (farthest away from the body) and wrap proximally (closer to the body)   Example: If you had surgery on your leg and you do not have a splint on, start the ace wrap at the toes and work your way up to the thigh        If you had surgery on your upper extremity and do not have a splint on, start the ace wrap at your fingers and work  your way up to the upper arm  IF YOU ARE IN A SPLINT OR CAST DO NOT REMOVE IT FOR ANY REASON   If your splint gets wet for any reason please contact the office immediately. You may shower in your splint or cast as long as you keep it dry.  This can be done by wrapping in a cast cover or garbage back (or similar)  Do Not stick any thing down your splint or cast such as pencils, money, or hangers to try and scratch yourself with.  If you feel itchy take benadryl as prescribed on the bottle for itching  IF YOU ARE IN A CAM BOOT (BLACK BOOT)  You may remove boot periodically. Perform daily dressing changes as noted below.  Wash the liner of the boot regularly and wear a sock when wearing the boot. It is recommended that you sleep in the boot until told otherwise    Call office for the following: Temperature greater than 101F Persistent nausea and vomiting Severe uncontrolled pain Redness, tenderness, or signs of infection (pain, swelling, redness, odor or green/yellow discharge around the site) Difficulty breathing, headache or visual disturbances Hives Persistent dizziness or light-headedness Extreme fatigue Any other questions or concerns you  may have after discharge  In an emergency, call 911 or go to an Emergency Department at a nearby hospital  HELPFUL INFORMATION  If you had a block, it will wear off between 8-24 hrs postop typically.  This is period when your pain may go from nearly zero to the pain you would have had postop without the block.  This is an abrupt transition but nothing dangerous is happening.  You may take an extra dose of narcotic when this happens.  You should wean off your narcotic medicines as soon as you are able.  Most patients will be off or using minimal narcotics before their first postop appointment.   We suggest you use the pain medication the first night prior to going to bed, in order to ease any pain when the anesthesia wears off. You should avoid taking  pain medications on an empty stomach as it will make you nauseous.  Do not drink alcoholic beverages or take illicit drugs when taking pain medications.  In most states it is against the law to drive while you are in a splint or sling.  And certainly against the law to drive while taking narcotics.  You may return to work/school in the next couple of days when you feel up to it.   Pain medication may make you constipated.  Below are a few solutions to try in this order: Decrease the amount of pain medication if you aren't having pain. Drink lots of decaffeinated fluids. Drink prune juice and/or each dried prunes  If the first 3 don't work start with additional solutions Take Colace - an over-the-counter stool softener Take Senokot - an over-the-counter laxative Take Miralax - a stronger over-the-counter laxative     CALL THE OFFICE WITH ANY QUESTIONS OR CONCERNS: [phone]   VISIT OUR WEBSITE FOR ADDITIONAL INFORMATION: [email or web address]

## 2023-06-01 ENCOUNTER — Encounter (HOSPITAL_COMMUNITY): Payer: Self-pay | Admitting: Orthopedic Surgery

## 2023-06-02 ENCOUNTER — Encounter (HOSPITAL_COMMUNITY): Payer: Self-pay | Admitting: Orthopedic Surgery

## 2023-06-23 ENCOUNTER — Other Ambulatory Visit: Payer: Self-pay | Admitting: Internal Medicine

## 2023-06-23 DIAGNOSIS — R110 Nausea: Secondary | ICD-10-CM

## 2023-06-23 DIAGNOSIS — K219 Gastro-esophageal reflux disease without esophagitis: Secondary | ICD-10-CM

## 2023-06-24 NOTE — Op Note (Signed)
 05/30/2023  11:14 AM  PATIENT:  Renee Reilly  March 10, 1965 female   MEDICAL RECORD NUMBER: [id number]  PRE-OPERATIVE DIAGNOSIS:   1. LEFT ANKLE BIMALLEOLAR EQUIVALENT 2. POSSIBLE LEFT ANKLE SYNDESMOTIC DISRUPTION  POST-OPERATIVE DIAGNOSIS:   1. LEFT ANKLE BIMALLEOLAR EQUIVALENT 2. LEFT ANKLE SYNDESMOTIC DISRUPTION  PROCEDURE:   OPEN REDUCTION INTERNAL FIXATION OF LEFT LATERAL MALLEOLUS FRACTURE OPEN REDUCTION INTERNAL FIXATION OF THE SYNDESMOSIS MANUAL APPLICATION OF STRESS ANKLE SYNDESMOSIS UNDER FLUOROSCOPY  SURGEON:  Homero Luster. Guyann Leitz, M.D.  ASSISTANT:  Marisela Sicks, PA-C.  ANESTHESIA:  General.  COMPLICATIONS:  None.  TOURNIQUET: None.  ESTIMATED BLOOD LOSS:  50 mL.  DISPOSITION:  To PACU.  CONDITION:  Stable.  DELAY START OF DVT PROPHYLAXIS BECAUSE OF BLEEDING RISK: NO  BRIEF SUMMARY AND INDICATIONS FOR PROCEDURE:  The patient is a 59 y.o. who sustained a fracture dislocation of the ankle, treated with reduction at the time of presentation to the Emergency Department, followed  by splint application. Patient subsequently seen in the office to reassess soft tissues, which now show sufficient swelling resolution to allow for surgical repair. Stress x-ray indicated deltoid ligament disruption and possible syndesmotic injury, as well. I discussed with the patient the risks and benefits of surgery including the  possibility of infection, DVT, PE, nerve injury, vessel injury, loss of motion, arthritis, symptomatic hardware, heart attack, stroke and need for further surgery, among others. We specifically discussed syndesmotic repair and that some of the implants used for this would likely require subsequent removal. After acknowledging these risks, consent was provided to proceed.  SUMMARY OF PROCEDURE:  The patient was taken to the operating room after administration of a regional block and preoperative antibiotics.  The left lower extremity was prepped and draped in the usual  sterile fashion.  A tourniquet was placed about the thigh but never inflated during the procedure.  A timeout was held, and then an incision was made directly over the lateral malleolus with careful dissection to avoid injury to the superficial peroneal nerve.  The periosteum was left intact as we continued deep dissection.  The fracture site was identified and curettage and lavage used to remove hematoma.  With the assistance of distal manipulation and placement of tenaculums, we were able to obtain an anatomic reduction with  interdigitation of the primary fracture fragments.  Because of the angle and comminution of the fracture site, I was unable to place a lag screw but was able to hold this interdigitated during application of a lateral malleolus plate.  We used the Paragon system and placed standard fixation in the shaft and distal lateral malleolus, confirming plate position with x-ray and then continuing with a standard fixation as well as a locked fixation.  Final images showed appropriate reductional replacement, trajectory, and length.   I then performed an external rotation stress view of the ankle under live fluoroscopy. Instability was identified by lateral translation of the talus, widening of the syndesmotiic interval, and widening of the medial clear space. Consequently we proceeded with syndesmotic fixation. A small stab incision was made anteromedially and the pointed tenaculum used to apply pressure for reduction from head of most distal screw in the plate. A zip-tight suture anchor and quadricortical screw were then placed from the fibula into the tibia with 15 degrees of anteversion.    PROGNOSIS: The patient will be nonweightbearing in the splint with ice and elevation over the next 3 to 5 days.  We will plan to see her back in the office  in 10-14 days for removal of sutures and transition to a Cam boot with unrestricted range of motion of the  ankle at that time.  Weightbearing at 6-8  weeks.

## 2023-07-18 ENCOUNTER — Other Ambulatory Visit: Payer: Self-pay | Admitting: Internal Medicine

## 2023-07-18 DIAGNOSIS — K219 Gastro-esophageal reflux disease without esophagitis: Secondary | ICD-10-CM

## 2023-07-18 DIAGNOSIS — R110 Nausea: Secondary | ICD-10-CM

## 2023-08-15 ENCOUNTER — Other Ambulatory Visit: Payer: Self-pay | Admitting: Internal Medicine

## 2023-08-15 DIAGNOSIS — K219 Gastro-esophageal reflux disease without esophagitis: Secondary | ICD-10-CM

## 2023-08-15 DIAGNOSIS — R110 Nausea: Secondary | ICD-10-CM

## 2023-09-15 ENCOUNTER — Other Ambulatory Visit: Payer: Self-pay | Admitting: Internal Medicine

## 2023-09-15 DIAGNOSIS — R110 Nausea: Secondary | ICD-10-CM

## 2023-09-15 DIAGNOSIS — K219 Gastro-esophageal reflux disease without esophagitis: Secondary | ICD-10-CM

## 2023-09-27 ENCOUNTER — Other Ambulatory Visit: Payer: Self-pay

## 2023-09-27 ENCOUNTER — Encounter (HOSPITAL_COMMUNITY): Payer: Self-pay | Admitting: Orthopedic Surgery

## 2023-09-27 NOTE — Progress Notes (Signed)
 SDW call  Patient was given pre-op instructions over the phone. Patient verbalized understanding of instructions provided.     PCP - Dr. Haku Kahoano Cardiologist -  Pulmonary:    PPM/ICD - denies Device Orders - na Rep Notified - na   Chest x-ray - na EKG -  05/30/2023 Stress Test - ECHO - 01/22/2021 Cardiac Cath -   Sleep Study/sleep apnea/CPAP: denies  Type II diabetic. Does not check her blood sugar.  States last A1C 6.0 approx 3 months ago, unable to find those results in her chart Fasting Blood sugar range How often check sugars   Blood Thinner Instructions: denies Aspirin Instructions: states last dose 09/24/2023   ERAS Protcol - NPO   Anesthesia review: Yes. CAD, HTN, DM, heart murmur as a child  - no issues   Patient denies shortness of breath, fever, cough and chest pain over the phone call  Your procedure is scheduled on Thursday September 28, 2023  Report to [facility] Main Entrance A at  0530  A.M., then check in with the Admitting office.  Call this number if you have problems the morning of surgery:  [phone]   If you have any questions prior to your surgery date call [phone]: Open Monday-Friday 8am-4pm If you experience any cold or flu symptoms such as cough, fever, chills, shortness of breath, etc. between now and your scheduled surgery, please notify us  at the above number    Remember:  Do not eat or drink after midnight the night before your surgery  Take these medicines the morning of surgery with A SIP OF WATER:  Celexa , fenofibrate , neurontin , advair, prilosec, pravachol, roxicodone   As needed: Tylenol , flonase , robaxin, zofran , astelin  As of today, STOP taking any Aspirin (unless otherwise instructed by your surgeon) Aleve , Naproxen , Ibuprofen , Motrin , Advil , Goody's, BC's, all herbal medications, fish oil, and all vitamins.

## 2023-09-27 NOTE — Anesthesia Preprocedure Evaluation (Signed)
 Anesthesia Evaluation    Reviewed: Allergy & Precautions, Patient's Chart, lab work & pertinent test results  History of Anesthesia Complications Negative for: history of anesthetic complications  Airway        Dental   Pulmonary COPD,  COPD inhaler, Current Smoker          Cardiovascular hypertension, Pt. on medications + CAD       Neuro/Psych   Anxiety Depression    negative neurological ROS     GI/Hepatic Neg liver ROS,GERD  Medicated,,  Endo/Other  diabetes, Type 2, Oral Hypoglycemic Agents    Renal/GU negative Renal ROS  negative genitourinary   Musculoskeletal  (+) Arthritis ,  SYMPTOMATIC HARDWARE LEFT FIBULA   Abdominal   Peds  Hematology negative hematology ROS (+)   Anesthesia Other Findings Day of surgery medications reviewed with patient.  Reproductive/Obstetrics negative OB ROS                              Anesthesia Physical Anesthesia Plan  ASA: 2  Anesthesia Plan: General   Post-op Pain Management: Tylenol  PO (pre-op)*   Induction: Intravenous  PONV Risk Score and Plan: 3 and Treatment may vary due to age or medical condition, Ondansetron , Dexamethasone  and Midazolam   Airway Management Planned: LMA  Additional Equipment: None  Intra-op Plan:   Post-operative Plan: Extubation in OR  Informed Consent:   Plan Discussed with:   Anesthesia Plan Comments:         Anesthesia Quick Evaluation

## 2023-09-27 NOTE — H&P (Signed)
 Orthopaedic Trauma Service (OTS) Consult   Patient ID: Renee Reilly MRN: [id number] DOB/AGE: 09/19/1965 58 y.o.   HPI: [name] is an 58 y.o. female s/p ORIF L ankle fracture including syndesmosis 05/30/2023.  Patient has recovered well but still has some pain and stiffness in the fibula which is activity related.  She is tender over her syndesmotic screw.  No other issues noted.  She is recovering nicely.  Presents today for removal of her syndesmotic screw.  No restrictions postop.  Risks and benefits of surgery reviewed the patient she wished to proceed  Past Medical History:  Diagnosis Date   Anxiety    Arthritis    CAD (coronary artery disease), native coronary artery    coronary Ca score of 7.1 and mild CAD  of the RCA with soft plaque < 50% in the mid RCA   Carpal tunnel syndrome    Depression    Diabetes mellitus without complication (HCC)    Fatty liver    per patient   Heart murmur    had since she was a child per patient   Hypertension     Past Surgical History:  Procedure Laterality Date   BALLOON DILATION N/A 04/22/2021   Procedure: BALLOON DILATION;  Surgeon: Cindie Carlin POUR, DO;  [facility];  Service: Endoscopy;  Laterality: N/A;   BIOPSY  04/22/2021   Procedure: BIOPSY;  Surgeon: Cindie Carlin POUR, DO;  [facility];  Service: Endoscopy;;   COLONOSCOPY WITH PROPOFOL  N/A 04/22/2021   Surgeon: Cindie Carlin POUR, DO; nonbleeding internal hemorrhoids.  Repeat in 10 years.   ESOPHAGOGASTRODUODENOSCOPY (EGD) WITH PROPOFOL  N/A 04/22/2021   Surgeon: Cindie Carlin POUR, DO;  Normal esophagus s/p empiric dilation, erythematous mucosa in the gastric body and antrum biopsied, normal examined duodenum.   ORIF ANKLE FRACTURE Left 05/30/2023   Procedure: OPEN REDUCTION INTERNAL FIXATION (ORIF) ANKLE FRACTURE;  Surgeon: Celena Sharper, MD;  Location: [facility] OR;  Service: Orthopedics;  Laterality: Left;   SYNDESMOSIS REPAIR Left 05/30/2023    Procedure: REPAIR, SYNDESMOSIS, ANKLE;  Surgeon: Celena Sharper, MD;  Location: [facility] OR;  Service: Orthopedics;  Laterality: Left;   TUBAL LIGATION      Family History  Problem Relation Age of Onset   Cancer Mother        Liver cancer   Diabetes Brother    Cancer Other     Social History:  reports that she has been smoking cigarettes. She has never used smokeless tobacco. She reports current alcohol use. She reports that she does not use drugs.  Allergies:  Allergies  Allergen Reactions   Penicillins Anaphylaxis    Patient tolerates Ancef  -- administration on 05/30/2023    Medications: I have reviewed the patient's current medications. Current Outpatient Medications  Medication Instructions   acetaminophen  (TYLENOL ) 1,300 mg, Every 8 hours PRN   aspirin 81 mg, Daily   Azelastine HCl 137 MCG/SPRAY SOLN 1 spray, Daily PRN   citalopram  (CELEXA ) 20 mg, Oral, Daily   fenofibrate  (TRICOR ) 145 mg, Daily   fluticasone -salmeterol (ADVAIR) 100-50 MCG/ACT AEPB 1 puff, Daily   gabapentin  (NEURONTIN ) 300 mg, 2 times daily   Jardiance 10 mg, Daily   lisinopril  (ZESTRIL ) 10 mg, Oral, Daily   metFORMIN  (GLUCOPHAGE ) 1,000 mg, Oral, 2 times daily with meals   methocarbamol (ROBAXIN) 500 mg, Daily   omeprazole  (PRILOSEC) 40 mg, Oral, Daily   ondansetron  (ZOFRAN ) 4 MG tablet TAKE 1 TABLET BY MOUTH  EVERY 8 HOURS AS NEEDED FOR NAUSEA FOR VOMITING   oxyCODONE  (ROXICODONE ) 5 mg, Oral, Every 6 hours PRN   pravastatin (PRAVACHOL) 10 mg, Daily   traZODone  (DESYREL ) 100 mg, Oral, At bedtime PRN   Vitamin D  (Ergocalciferol ) (DRISDOL) 50,000 Units, Oral, Weekly     No results found for this or any previous visit (from the past 48 hours).  No results found.  Intake/Output    None      ROS Left ankle pain  There were no vitals taken for this visit. Physical Exam Gen: NAD, appears well Lungs: unlabored Left Lower Extremity   Surgical wounds are well-healed  Ambulates with slight limp but  overall much improved gait  No medial malleolus tenderness  No tenderness over fibula other than syndesmotic screw  Good ankle motion is noted  Distal motor and sensory function are intact   Assessment/Plan:  58 year old female status post ORIF left ankle fracture including syndesmotic repair with symptomatic hardware left syndesmosis  - Symptomatic hardware left ankle  OR for removal of syndesmotic screw  No restrictions postop  Weight-bear as tolerated postop, range of motion as tolerated postop  Risks and benefits reviewed with patient and she wished to proceed   Outpatient surgery   Francis MICAEL Mt, PA-C [phone] (C) 09/27/2023, 2:29 PM  [facility] [address] [place] [address] [phone] (F)    After 5pm and on the weekends please log on to Amion, go to orthopaedics and the look under the [facility] Call for the provider(s) on call. You can also call our office at [phone] and then follow the prompts to be connected to the call team.

## 2023-09-28 ENCOUNTER — Ambulatory Visit (HOSPITAL_COMMUNITY): Admission: RE | Admit: 2023-09-28 | Payer: MEDICAID | Source: Home / Self Care | Admitting: Orthopedic Surgery

## 2023-09-28 ENCOUNTER — Encounter (HOSPITAL_COMMUNITY): Payer: Self-pay | Admitting: Physician Assistant

## 2023-09-28 ENCOUNTER — Other Ambulatory Visit (HOSPITAL_COMMUNITY): Payer: Self-pay | Admitting: Orthopedic Surgery

## 2023-09-28 ENCOUNTER — Encounter (HOSPITAL_COMMUNITY): Admission: RE | Payer: Self-pay | Source: Home / Self Care

## 2023-09-28 SURGERY — REMOVAL, HARDWARE
Anesthesia: Choice | Laterality: Left

## 2023-09-28 MED ORDER — FENTANYL CITRATE (PF) 250 MCG/5ML IJ SOLN
INTRAMUSCULAR | Status: AC
  Filled 2023-09-28: qty 5

## 2023-09-28 MED ORDER — MIDAZOLAM HCL 2 MG/2ML IJ SOLN
INTRAMUSCULAR | Status: AC
  Filled 2023-09-28: qty 2

## 2023-09-28 MED ORDER — PROPOFOL 10 MG/ML IV BOLUS
INTRAVENOUS | Status: AC
  Filled 2023-09-28: qty 20

## 2023-09-28 NOTE — Progress Notes (Signed)
 Patient called, states something came up with her daughter and she does not have a ride for surgery this morning.  Charlotte in [place] made aware.  Message sent to Francis Mt PA-C

## 2023-09-29 ENCOUNTER — Other Ambulatory Visit: Payer: Self-pay | Admitting: Internal Medicine

## 2023-09-29 DIAGNOSIS — R110 Nausea: Secondary | ICD-10-CM

## 2023-09-29 DIAGNOSIS — K219 Gastro-esophageal reflux disease without esophagitis: Secondary | ICD-10-CM

## 2023-10-09 ENCOUNTER — Telehealth: Payer: Self-pay

## 2023-10-09 ENCOUNTER — Other Ambulatory Visit: Payer: Self-pay | Admitting: Internal Medicine

## 2023-10-09 DIAGNOSIS — K219 Gastro-esophageal reflux disease without esophagitis: Secondary | ICD-10-CM

## 2023-10-09 DIAGNOSIS — R110 Nausea: Secondary | ICD-10-CM

## 2023-10-09 NOTE — Telephone Encounter (Signed)
 Phoned and LMOVM for the pt to return call and that an appt is needed before further refills

## 2023-10-13 ENCOUNTER — Encounter (HOSPITAL_COMMUNITY): Payer: Self-pay | Admitting: Orthopedic Surgery

## 2023-10-13 ENCOUNTER — Other Ambulatory Visit: Payer: Self-pay

## 2023-10-13 NOTE — Progress Notes (Signed)
 PCP - Kahoano, Haku K, MD  Cardiologist - Per patient she has a new patient visit next month at [facility] at request of her PCP. Patient denies any chest pain and reports no problems with heart murmur.  PPM/ICD - denies Device Orders - n/a Rep Notified - n/a  Chest x-ray - denies EKG - 05-30-23 Stress Test - denies ECHO - 01-22-21 Cardiac Cath - denies  CPAP - denies  DM -patient does not check blood sugar. MD monitors A1c. Per patient not sure what last A1c was.  JARDIANCE last dose 10-13-23 metFORMIN  Last dose 10-16-23  Blood Thinner Instructions: denies Aspirin Instructions: instructed patient to follow up with surgeon for instructions  ERAS Protcol - clear liquids until 5:00  COVID TEST- n/a  Anesthesia review: yes, hx of htn, heart murmur, cad  Patient verbally denies any shortness of breath, fever, cough and chest pain during phone call   -------------  SDW INSTRUCTIONS given:  Your procedure is scheduled on October 17, 2023.  Report to [facility] Main Entrance A at 5:30 A.M., and check in at the Admitting office.  Call this number if you have problems the morning of surgery:  [phone]   Remember:  Do not eat after midnight the night before your surgery  You may drink clear liquids until 5:00 the morning of your surgery.   Clear liquids allowed are: Water, Non-Citrus Juices (without pulp), Carbonated Beverages, Clear Tea, Black Coffee Only, and Gatorade    Take these medicines the morning of surgery with A SIP OF WATER  acetaminophen  (TYLENOL )  Azelastine   citalopram  (CELEXA )  fenofibrate  (TRICOR )  fluticasone -salmeterol (ADVAIR)  gabapentin  (NEURONTIN )  methocarbamol (ROBAXIN)  omeprazole  (PRILOSEC)  ondansetron  (ZOFRAN )  oxyCODONE  (ROXICODONE )  pravastatin (PRAVACHOL)   As of today, STOP taking any Aspirin (unless otherwise instructed by your surgeon) Aleve , Naproxen , Ibuprofen , Motrin , Advil , Goody's, BC's, all herbal medications, fish oil,  and all vitamins.                      Do not wear jewelry, make up, or nail polish            Do not wear lotions, powders, perfumes/colognes, or deodorant.            Do not shave 48 hours prior to surgery.  Men may shave face and neck.            Do not bring valuables to the hospital.            [facility] is not responsible for any belongings or valuables.  Do NOT Smoke (Tobacco/Vaping) 24 hours prior to your procedure If you use a CPAP at night, you may bring all equipment for your overnight stay.   Contacts, glasses, dentures or bridgework may not be worn into surgery.      For patients admitted to the hospital, discharge time will be determined by your treatment team.   Patients discharged the day of surgery will not be allowed to drive home, and someone needs to stay with them for 24 hours.    Special instructions:   [place]- Preparing For Surgery  Before surgery, you can play an important role. Because skin is not sterile, your skin needs to be as free of germs as possible. You can reduce the number of germs on your skin by washing with CHG (chlorahexidine gluconate) Soap before surgery.  CHG is an antiseptic cleaner which kills germs and bonds with the skin to  continue killing germs even after washing.    Oral Hygiene is also important to reduce your risk of infection.  Remember - BRUSH YOUR TEETH THE MORNING OF SURGERY WITH YOUR REGULAR TOOTHPASTE  Please do not use if you have an allergy to CHG or antibacterial soaps. If your skin becomes reddened/irritated stop using the CHG.  Do not shave (including legs and underarms) for at least 48 hours prior to first CHG shower. It is OK to shave your face.  Please follow these instructions carefully.   Shower the NIGHT BEFORE SURGERY and the MORNING OF SURGERY with DIAL Soap.   Pat yourself dry with a CLEAN TOWEL.  Wear CLEAN PAJAMAS to bed the night before surgery  Place CLEAN SHEETS on your bed the night of your first  shower and DO NOT SLEEP WITH PETS.   Day of Surgery: Please shower morning of surgery  Wear Clean/Comfortable clothing the morning of surgery Do not apply any deodorants/lotions.   Remember to brush your teeth WITH YOUR REGULAR TOOTHPASTE.   Questions were answered. Patient verbalized understanding of instructions.

## 2023-10-16 NOTE — Anesthesia Preprocedure Evaluation (Addendum)
 Anesthesia Evaluation  Patient identified by MRN, date of birth, ID band Patient awake    Reviewed: Allergy & Precautions, NPO status , Patient's Chart, lab work & pertinent test results  Airway Mallampati: III  TM Distance: >3 FB Neck ROM: Full    Dental  (+) Edentulous Upper, Edentulous Lower, Dental Advisory Given   Pulmonary Current Smoker and Patient abstained from smoking.   Pulmonary exam normal breath sounds clear to auscultation       Cardiovascular hypertension, Pt. on medications + CAD  Normal cardiovascular exam Rhythm:Regular Rate:Normal  TTE 2022  1. Left ventricular ejection fraction, by estimation, is 55 to 60%. The  left ventricle has normal function. The left ventricle has no regional  wall motion abnormalities. Left ventricular diastolic parameters are  indeterminate.   2. Right ventricular systolic function is normal. The right ventricular  size is normal. Tricuspid regurgitation signal is inadequate for assessing  PA pressure.   3. The mitral valve is grossly normal. Trivial mitral valve  regurgitation.   4. The aortic valve is tricuspid. Aortic valve regurgitation is not  visualized.   5. The inferior vena cava is normal in size with greater than 50%  respiratory variability, suggesting right atrial pressure of 3 mmHg     Neuro/Psych  PSYCHIATRIC DISORDERS Anxiety Depression    negative neurological ROS     GI/Hepatic Neg liver ROS,GERD  ,,  Endo/Other  diabetes, Type 2, Oral Hypoglycemic Agents    Renal/GU negative Renal ROS  negative genitourinary   Musculoskeletal  (+) Arthritis ,    Abdominal   Peds  Hematology negative hematology ROS (+)   Anesthesia Other Findings  History includes smoking, HTN, CAD (mild RCA 2022 CCTA), childhood murmur (trivial MR 01/2021 TTE), DM2, peripheral neuropathy, fatty liver. + Cocaine UDS 05/2022 (no current use documented).     Reproductive/Obstetrics                              Anesthesia Physical Anesthesia Plan  ASA: 3  Anesthesia Plan: General   Post-op Pain Management: Tylenol  PO (pre-op)*   Induction: Intravenous  PONV Risk Score and Plan: 2 and Midazolam , Dexamethasone  and Ondansetron   Airway Management Planned: Oral ETT  Additional Equipment:   Intra-op Plan:   Post-operative Plan: Extubation in OR  Informed Consent: I have reviewed the patients History and Physical, chart, labs and discussed the procedure including the risks, benefits and alternatives for the proposed anesthesia with the patient or authorized representative who has indicated his/her understanding and acceptance.     Dental advisory given  Plan Discussed with: CRNA  Anesthesia Plan Comments: (PAT note written 10/16/2023 by Allison Zelenak, PA-C.  )         Anesthesia Quick Evaluation

## 2023-10-16 NOTE — Progress Notes (Signed)
 Anesthesia Chart Review: Renee Reilly  Case: [id number] Date/Time: 10/17/23 0745   Procedure: REMOVAL, HARDWARE (Left)   Anesthesia type: Choice   Diagnosis: Painful orthopaedic hardware (HCC) [T84.84XA]   Pre-op diagnosis: SYMPTOMATIC HARDWARE LEFT FIBULA   [facility] ROOM [facility] / [facility]   Surgeons: Celena Sharper, MD       DISCUSSION: Patient is a 58 year old female scheduled for the above procedure. She fell on 05/07/23 and sustained a left ankle fracture, s/p ORIF left lateral malleolus and of the syndesmosis on 05/30/2023.   History includes smoking, HTN, CAD (mild RCA 2022 CCTA), childhood murmur (trivial MR 01/2021 TTE), DM2, peripheral neuropathy, fatty liver. + Cocaine UDS 05/2022 (no current use documented).    She had cardiology evaluation by Dr. Wilbert Bihari on 12/10/20 for murmur, chronic exertional dyspnea, chest pain. She had a CCTA on 12/21/20 that showed and elevated CAC score of 7.1 (82nd percentile) but only mild CAD with soft plaque < 50% in the mid RCA.  Non cardiac portion showed mucosal thickening in the distal esophagus and fatty liver and referral to GI Dr. Saintclair recommended. Continue ASA and statin.    Last Jardiance 10/13/2023. Last metformin  10/16/2023. No recent A1c seen and patient does not monitor home CBGs. Glucose on 05/30/2023 was 140.   She has a same-day work, so anesthesia team to evaluate on the day of surgery.  Labs on arrival as indicated.    VS: Ht 5' 6 (1.676 m)   Wt 86.2 kg   LMP  (LMP Unknown)   BMI 30.67 kg/m  BP Readings from Last 3 Encounters:  05/30/23 119/72  05/07/23 110/63  12/28/22 104/74   Pulse Readings from Last 3 Encounters:  05/30/23 69  05/07/23 80  12/28/22 86    PROVIDERS: Kahoano, Haku K, MD is listed as PCP, but reported new provider at [facility] scheduled next month.    LABS: For day of surgery as indicated.As of 05/30/23, Cr 0.68, H/H 14.9/45.5, PLT 285, glucose 140. UDS + cocaine on 06/13/22 (see [facility]  Everywhere). No current illicit drug use is documented.         EKG:   EKG 05/30/2023: NSR     CV: Echo 01/22/21: IMPRESSIONS   1. Left ventricular ejection fraction, by estimation, is 55 to 60%. The  left ventricle has normal function. The left ventricle has no regional  wall motion abnormalities. Left ventricular diastolic parameters are  indeterminate.   2. Right ventricular systolic function is normal. The right ventricular  size is normal. Tricuspid regurgitation signal is inadequate for assessing  PA pressure.   3. The mitral valve is grossly normal. Trivial mitral valve  regurgitation.   4. The aortic valve is tricuspid. Aortic valve regurgitation is not  visualized.   5. The inferior vena cava is normal in size with greater than 50%  respiratory variability, suggesting right atrial pressure of 3 mmHg.  - Comparison(s): No prior Echocardiogram.      CT Coronary 12/21/20: FINDINGS: - No LA appendage thrombus. Pulmonary veins drain normally to the left atrium. - Calcium  Score: 7.1 Agatston units. - Coronary Arteries: Right dominant with no anomalies LM: No plaque or stenosis. LAD system: No plaque or stenosis. Circumflex system: No plaque or stenosis. RCA system: Primarily soft plaque in the mid RCA with mild (<50%)stenosis.   IMPRESSION: 1. Coronary artery calcium  score 7.1 Agatston units. This places the patient in the 82nd percentile for age and gender, suggesting high risk for future  cardiac events. 2.  Mild stenosis mid RCA, soft plaque primarily.   Past Medical History:  Diagnosis Date   Anxiety    Arthritis    CAD (coronary artery disease), native coronary artery    coronary Ca score of 7.1 and mild CAD  of the RCA with soft plaque < 50% in the mid RCA   Carpal tunnel syndrome    Depression    Diabetes mellitus without complication (HCC)    Fatty liver    Heart murmur    had since she was a child per patient   Hypertension     Past Surgical History:   Procedure Laterality Date   BALLOON DILATION N/A 04/22/2021   Procedure: BALLOON DILATION;  Surgeon: Cindie Carlin POUR, DO;  [facility];  Service: Endoscopy;  Laterality: N/A;   BIOPSY  04/22/2021   Procedure: BIOPSY;  Surgeon: Cindie Carlin POUR, DO;  [facility];  Service: Endoscopy;;   COLONOSCOPY WITH PROPOFOL  N/A 04/22/2021   Surgeon: Cindie Carlin POUR, DO; nonbleeding internal hemorrhoids.  Repeat in 10 years.   ESOPHAGOGASTRODUODENOSCOPY (EGD) WITH PROPOFOL  N/A 04/22/2021   Surgeon: Cindie Carlin POUR, DO;  Normal esophagus s/p empiric dilation, erythematous mucosa in the gastric body and antrum biopsied, normal examined duodenum.   ORIF ANKLE FRACTURE Left 05/30/2023   Procedure: OPEN REDUCTION INTERNAL FIXATION (ORIF) ANKLE FRACTURE;  Surgeon: Celena Sharper, MD;  [facility];  Service: Orthopedics;  Laterality: Left;   SYNDESMOSIS REPAIR Left 05/30/2023   Procedure: REPAIR, SYNDESMOSIS, ANKLE;  Surgeon: Celena Sharper, MD;  Location: [facility];  Service: Orthopedics;  Laterality: Left;   TUBAL LIGATION      MEDICATIONS: No current facility-administered medications for this encounter.    desvenlafaxine (PRISTIQ) 50 MG 24 hr tablet   diazepam (VALIUM) 5 MG tablet   acetaminophen  (TYLENOL ) 650 MG CR tablet   aspirin 81 MG chewable tablet   Azelastine HCl 137 MCG/SPRAY SOLN   citalopram  (CELEXA ) 20 MG tablet   fenofibrate  (TRICOR ) 145 MG tablet   fluticasone -salmeterol (ADVAIR) 100-50 MCG/ACT AEPB   gabapentin  (NEURONTIN ) 300 MG capsule   JARDIANCE 10 MG TABS tablet   lisinopril  (ZESTRIL ) 10 MG tablet   metFORMIN  (GLUCOPHAGE ) 1000 MG tablet   methocarbamol (ROBAXIN) 500 MG tablet   omeprazole  (PRILOSEC) 40 MG capsule   ondansetron  (ZOFRAN ) 4 MG tablet   oxyCODONE  (ROXICODONE ) 5 MG immediate release tablet   pravastatin (PRAVACHOL) 10 MG tablet   traZODone  (DESYREL ) 100 MG tablet   Vitamin D , Ergocalciferol , (DRISDOL) 1.25 MG (50000 UNIT) CAPS capsule     Isaiah Ruder, PA-C Surgical Short Stay/Anesthesiology [facility] Phone [phone] [facility] Phone [phone] 10/16/2023 12:01 PM

## 2023-10-17 ENCOUNTER — Other Ambulatory Visit: Payer: Self-pay

## 2023-10-17 ENCOUNTER — Encounter (HOSPITAL_COMMUNITY): Payer: Self-pay | Admitting: Orthopedic Surgery

## 2023-10-17 ENCOUNTER — Other Ambulatory Visit (HOSPITAL_COMMUNITY): Payer: Self-pay

## 2023-10-17 ENCOUNTER — Ambulatory Visit (HOSPITAL_BASED_OUTPATIENT_CLINIC_OR_DEPARTMENT_OTHER): Payer: MEDICAID | Admitting: Vascular Surgery

## 2023-10-17 ENCOUNTER — Ambulatory Visit (HOSPITAL_COMMUNITY): Payer: MEDICAID

## 2023-10-17 ENCOUNTER — Encounter (HOSPITAL_COMMUNITY)
Admission: RE | Disposition: A | Discharge status: Home / Self Care | Payer: Self-pay | Source: Home / Self Care | Attending: Orthopedic Surgery

## 2023-10-17 ENCOUNTER — Ambulatory Visit (HOSPITAL_COMMUNITY): Payer: MEDICAID | Admitting: Vascular Surgery

## 2023-10-17 ENCOUNTER — Ambulatory Visit (HOSPITAL_COMMUNITY)
Admission: RE | Admit: 2023-10-17 | Discharge: 2023-10-17 | Disposition: A | Discharge status: Home / Self Care | Payer: MEDICAID | Attending: Orthopedic Surgery | Admitting: Orthopedic Surgery

## 2023-10-17 DIAGNOSIS — E1142 Type 2 diabetes mellitus with diabetic polyneuropathy: Secondary | ICD-10-CM | POA: Diagnosis not present

## 2023-10-17 DIAGNOSIS — T8484XA Pain due to internal orthopedic prosthetic devices, implants and grafts, initial encounter: Secondary | ICD-10-CM | POA: Insufficient documentation

## 2023-10-17 DIAGNOSIS — Z8781 Personal history of (healed) traumatic fracture: Secondary | ICD-10-CM | POA: Insufficient documentation

## 2023-10-17 DIAGNOSIS — Y793 Surgical instruments, materials and orthopedic devices (including sutures) associated with adverse incidents: Secondary | ICD-10-CM | POA: Diagnosis not present

## 2023-10-17 DIAGNOSIS — K760 Fatty (change of) liver, not elsewhere classified: Secondary | ICD-10-CM | POA: Diagnosis not present

## 2023-10-17 DIAGNOSIS — I2510 Atherosclerotic heart disease of native coronary artery without angina pectoris: Secondary | ICD-10-CM | POA: Diagnosis not present

## 2023-10-17 DIAGNOSIS — F17210 Nicotine dependence, cigarettes, uncomplicated: Secondary | ICD-10-CM | POA: Insufficient documentation

## 2023-10-17 DIAGNOSIS — F32A Depression, unspecified: Secondary | ICD-10-CM | POA: Diagnosis not present

## 2023-10-17 DIAGNOSIS — F419 Anxiety disorder, unspecified: Secondary | ICD-10-CM | POA: Insufficient documentation

## 2023-10-17 DIAGNOSIS — I10 Essential (primary) hypertension: Secondary | ICD-10-CM | POA: Insufficient documentation

## 2023-10-17 DIAGNOSIS — K219 Gastro-esophageal reflux disease without esophagitis: Secondary | ICD-10-CM | POA: Insufficient documentation

## 2023-10-17 DIAGNOSIS — Z7984 Long term (current) use of oral hypoglycemic drugs: Secondary | ICD-10-CM | POA: Insufficient documentation

## 2023-10-17 HISTORY — PX: HARDWARE REMOVAL: SHX979

## 2023-10-17 LAB — CBC
HCT: 40.4 % (ref 36.0–46.0)
Hemoglobin: 13.7 g/dL (ref 12.0–15.0)
MCH: 31.5 pg (ref 26.0–34.0)
MCHC: 33.9 g/dL (ref 30.0–36.0)
MCV: 92.9 fL (ref 80.0–100.0)
Platelets: 224 K/uL (ref 150–400)
RBC: 4.35 MIL/uL (ref 3.87–5.11)
RDW: 12.4 % (ref 11.5–15.5)
WBC: 7.2 K/uL (ref 4.0–10.5)
nRBC: 0 % (ref 0.0–0.2)

## 2023-10-17 LAB — GLUCOSE, CAPILLARY
Glucose-Capillary: 133 mg/dL — ABNORMAL HIGH (ref 70–99)
Glucose-Capillary: 116 mg/dL — ABNORMAL HIGH (ref 70–99)
Glucose-Capillary: 126 mg/dL — ABNORMAL HIGH (ref 70–99)

## 2023-10-17 LAB — COMPREHENSIVE METABOLIC PANEL WITH GFR
ALT: 29 U/L (ref 0–44)
AST: 25 U/L (ref 15–41)
Albumin: 3.6 g/dL (ref 3.5–5.0)
Alkaline Phosphatase: 52 U/L (ref 38–126)
Anion gap: 7 (ref 5–15)
BUN: 13 mg/dL (ref 6–20)
CO2: 23 mmol/L (ref 22–32)
Calcium: 9.4 mg/dL (ref 8.9–10.3)
Chloride: 104 mmol/L (ref 98–111)
Creatinine, Ser: 0.69 mg/dL (ref 0.44–1.00)
GFR, Estimated: >60 mL/min (ref >60)
Glucose, Bld: 117 mg/dL — ABNORMAL HIGH (ref 70–99)
Potassium: 4.1 mmol/L (ref 3.5–5.1)
Sodium: 134 mmol/L — ABNORMAL LOW (ref 135–145)
Total Bilirubin: 0.6 mg/dL (ref 0.0–1.2)
Total Protein: 6.8 g/dL (ref 6.5–8.1)

## 2023-10-17 LAB — SURGICAL PCR SCREEN
MRSA, PCR: NEGATIVE
Staphylococcus aureus: POSITIVE — AB

## 2023-10-17 SURGERY — REMOVAL, HARDWARE
Anesthesia: General | Laterality: Left

## 2023-10-17 MED ORDER — FENTANYL CITRATE (PF) 100 MCG/2ML IJ SOLN
25.0000 ug | INTRAMUSCULAR | Status: DC | PRN
  Administered 2023-10-17 (×2): 25 ug via INTRAVENOUS

## 2023-10-17 MED ORDER — ROCURONIUM BROMIDE 10 MG/ML (PF) SYRINGE
PREFILLED_SYRINGE | INTRAVENOUS | Status: DC | PRN
  Administered 2023-10-17: 10 mg via INTRAVENOUS
  Administered 2023-10-17: 40 mg via INTRAVENOUS

## 2023-10-17 MED ORDER — OXYCODONE HCL 5 MG/5ML PO SOLN: 5.0000 mg | Freq: Once | ORAL | Status: AC | PRN

## 2023-10-17 MED ORDER — PHENYLEPHRINE 80 MCG/ML (10ML) SYRINGE FOR IV PUSH (FOR BLOOD PRESSURE SUPPORT)
PREFILLED_SYRINGE | INTRAVENOUS | Status: AC
  Filled 2023-10-17: qty 10

## 2023-10-17 MED ORDER — CHLORHEXIDINE GLUCONATE 0.12 % MT SOLN
15.0000 mL | Freq: Once | OROMUCOSAL | Status: AC
  Administered 2023-10-17: 15 mL via OROMUCOSAL
  Filled 2023-10-17: qty 15

## 2023-10-17 MED ORDER — EPHEDRINE 5 MG/ML INJ
INTRAVENOUS | Status: AC
  Filled 2023-10-17: qty 5

## 2023-10-17 MED ORDER — AMISULPRIDE (ANTIEMETIC) 5 MG/2ML IV SOLN
10.0000 mg | Freq: Once | INTRAVENOUS | Status: AC | PRN
  Administered 2023-10-17: 10 mg via INTRAVENOUS

## 2023-10-17 MED ORDER — 0.9 % SODIUM CHLORIDE (POUR BTL) OPTIME
TOPICAL | Status: DC | PRN
Start: 2023-10-17 — End: 2023-10-17
  Administered 2023-10-17: 1000 mL

## 2023-10-17 MED ORDER — MIDAZOLAM HCL 2 MG/2ML IJ SOLN
INTRAMUSCULAR | Status: DC | PRN
  Administered 2023-10-17: 2 mg via INTRAVENOUS

## 2023-10-17 MED ORDER — AMISULPRIDE (ANTIEMETIC) 5 MG/2ML IV SOLN
INTRAVENOUS | Status: AC
  Filled 2023-10-17: qty 4

## 2023-10-17 MED ORDER — CEFAZOLIN SODIUM-DEXTROSE 2-4 GM/100ML-% IV SOLN
2.0000 g | INTRAVENOUS | Status: AC
  Administered 2023-10-17: 2 g via INTRAVENOUS
  Filled 2023-10-17: qty 100

## 2023-10-17 MED ORDER — LACTATED RINGERS IV SOLN: INTRAVENOUS | Status: DC

## 2023-10-17 MED ORDER — ORAL CARE MOUTH RINSE: 15.0000 mL | Freq: Once | OROMUCOSAL | Status: AC

## 2023-10-17 MED ORDER — MIDAZOLAM HCL 2 MG/2ML IJ SOLN
INTRAMUSCULAR | Status: AC
  Filled 2023-10-17: qty 2

## 2023-10-17 MED ORDER — ONDANSETRON HCL 4 MG/2ML IJ SOLN
INTRAMUSCULAR | Status: DC | PRN
  Administered 2023-10-17: 4 mg via INTRAVENOUS

## 2023-10-17 MED ORDER — OXYCODONE HCL 5 MG PO TABS
5.0000 mg | ORAL_TABLET | Freq: Once | ORAL | Status: AC | PRN
  Administered 2023-10-17: 5 mg via ORAL

## 2023-10-17 MED ORDER — FENTANYL CITRATE (PF) 100 MCG/2ML IJ SOLN
INTRAMUSCULAR | Status: AC
  Filled 2023-10-17: qty 2

## 2023-10-17 MED ORDER — PROPOFOL 10 MG/ML IV BOLUS
INTRAVENOUS | Status: DC | PRN
  Administered 2023-10-17: 150 mg via INTRAVENOUS

## 2023-10-17 MED ORDER — ONDANSETRON 4 MG PO TBDP
4.0000 mg | ORAL_TABLET | Freq: Three times a day (TID) | ORAL | 0 refills | Status: DC | PRN
  Filled 2023-10-17: qty 20, 7d supply, fill #0

## 2023-10-17 MED ORDER — KETOROLAC TROMETHAMINE 10 MG PO TABS
10.0000 mg | ORAL_TABLET | Freq: Four times a day (QID) | ORAL | 0 refills | Status: AC | PRN
  Filled 2023-10-17: qty 20, 5d supply, fill #0

## 2023-10-17 MED ORDER — LIDOCAINE 2% (20 MG/ML) 5 ML SYRINGE
INTRAMUSCULAR | Status: AC
  Filled 2023-10-17: qty 5

## 2023-10-17 MED ORDER — INSULIN ASPART 100 UNIT/ML IJ SOLN: 0.0000 [IU] | INTRAMUSCULAR | Status: DC | PRN

## 2023-10-17 MED ORDER — ROCURONIUM BROMIDE 10 MG/ML (PF) SYRINGE
PREFILLED_SYRINGE | INTRAVENOUS | Status: AC
  Filled 2023-10-17: qty 10

## 2023-10-17 MED ORDER — LIDOCAINE 2% (20 MG/ML) 5 ML SYRINGE
INTRAMUSCULAR | Status: DC | PRN
  Administered 2023-10-17: 60 mg via INTRAVENOUS

## 2023-10-17 MED ORDER — FENTANYL CITRATE (PF) 250 MCG/5ML IJ SOLN
INTRAMUSCULAR | Status: AC
  Filled 2023-10-17: qty 5

## 2023-10-17 MED ORDER — OXYCODONE HCL 5 MG PO TABS
ORAL_TABLET | ORAL | Status: AC
  Filled 2023-10-17: qty 1

## 2023-10-17 MED ORDER — ONDANSETRON HCL 4 MG/2ML IJ SOLN
4.0000 mg | Freq: Once | INTRAMUSCULAR | Status: AC
  Administered 2023-10-17: 4 mg via INTRAVENOUS

## 2023-10-17 MED ORDER — ACETAMINOPHEN 500 MG PO TABS
1000.0000 mg | ORAL_TABLET | Freq: Once | ORAL | Status: AC
  Administered 2023-10-17: 1000 mg via ORAL
  Filled 2023-10-17: qty 2

## 2023-10-17 MED ORDER — PROPOFOL 10 MG/ML IV BOLUS
INTRAVENOUS | Status: AC
  Filled 2023-10-17: qty 20

## 2023-10-17 MED ORDER — DEXAMETHASONE SODIUM PHOSPHATE 10 MG/ML IJ SOLN
INTRAMUSCULAR | Status: AC
  Filled 2023-10-17: qty 1

## 2023-10-17 MED ORDER — FENTANYL CITRATE (PF) 250 MCG/5ML IJ SOLN
INTRAMUSCULAR | Status: DC | PRN
  Administered 2023-10-17: 100 ug via INTRAVENOUS
  Administered 2023-10-17: 75 ug via INTRAVENOUS
  Administered 2023-10-17: 50 ug via INTRAVENOUS

## 2023-10-17 MED ORDER — SUGAMMADEX SODIUM 200 MG/2ML IV SOLN
INTRAVENOUS | Status: DC | PRN
  Administered 2023-10-17: 200 mg via INTRAVENOUS

## 2023-10-17 MED ORDER — ONDANSETRON HCL 4 MG/2ML IJ SOLN
INTRAMUSCULAR | Status: AC
  Filled 2023-10-17: qty 2

## 2023-10-17 MED ORDER — DEXAMETHASONE SODIUM PHOSPHATE 10 MG/ML IJ SOLN
INTRAMUSCULAR | Status: DC | PRN
  Administered 2023-10-17: 4 mg via INTRAVENOUS

## 2023-10-17 SURGICAL SUPPLY — 51 items
BAG COUNTER SPONGE SURGICOUNT (BAG) ×1 IMPLANT
BANDAGE ESMARK 6X9 LF (GAUZE/BANDAGES/DRESSINGS) ×1 IMPLANT
BNDG COHESIVE 6X5 TAN ST LF (GAUZE/BANDAGES/DRESSINGS) ×1 IMPLANT
BNDG ELASTIC 3INX 5YD STR LF (GAUZE/BANDAGES/DRESSINGS) IMPLANT
BNDG ELASTIC 4X5.8 VLCR STR LF (GAUZE/BANDAGES/DRESSINGS) ×1 IMPLANT
BNDG ELASTIC 6INX 5YD STR LF (GAUZE/BANDAGES/DRESSINGS) ×1 IMPLANT
BNDG GAUZE DERMACEA FLUFF 4 (GAUZE/BANDAGES/DRESSINGS) ×2 IMPLANT
BRUSH SCRUB EZ PLAIN DRY (MISCELLANEOUS) ×2 IMPLANT
COVER SURGICAL LIGHT HANDLE (MISCELLANEOUS) ×2 IMPLANT
CUFF TOURN SGL QUICK 18X4 (TOURNIQUET CUFF) IMPLANT
CUFF TRNQT CYL 24X4X16.5-23 (TOURNIQUET CUFF) IMPLANT
CUFF TRNQT CYL 34X4.125X (TOURNIQUET CUFF) IMPLANT
DRAPE C-ARM 42X72 X-RAY (DRAPES) IMPLANT
DRAPE C-ARMOR (DRAPES) ×1 IMPLANT
DRAPE U-SHAPE 47X51 STRL (DRAPES) ×1 IMPLANT
DRSG ADAPTIC 3X8 NADH LF (GAUZE/BANDAGES/DRESSINGS) ×1 IMPLANT
ELECTRODE REM PT RTRN 9FT ADLT (ELECTROSURGICAL) ×1 IMPLANT
GAUZE SPONGE 4X4 12PLY STRL (GAUZE/BANDAGES/DRESSINGS) ×1 IMPLANT
GLOVE BIO SURGEON STRL SZ7.5 (GLOVE) ×1 IMPLANT
GLOVE BIO SURGEON STRL SZ8 (GLOVE) ×1 IMPLANT
GLOVE BIOGEL PI IND STRL 7.5 (GLOVE) ×1 IMPLANT
GLOVE BIOGEL PI IND STRL 8 (GLOVE) ×1 IMPLANT
GLOVE SURG ORTHO LTX SZ7.5 (GLOVE) ×2 IMPLANT
GOWN STRL REUS W/ TWL LRG LVL3 (GOWN DISPOSABLE) ×2 IMPLANT
GOWN STRL REUS W/ TWL XL LVL3 (GOWN DISPOSABLE) ×1 IMPLANT
KIT BASIN OR (CUSTOM PROCEDURE TRAY) ×1 IMPLANT
KIT TURNOVER KIT B (KITS) ×1 IMPLANT
MANIFOLD NEPTUNE II (INSTRUMENTS) ×1 IMPLANT
NDL 22X1.5 STRL (OR ONLY) (MISCELLANEOUS) IMPLANT
NEEDLE 22X1.5 STRL (OR ONLY) (MISCELLANEOUS) IMPLANT
NS IRRIG 1000ML POUR BTL (IV SOLUTION) ×1 IMPLANT
PACK ORTHO EXTREMITY (CUSTOM PROCEDURE TRAY) ×1 IMPLANT
PAD ARMBOARD POSITIONER FOAM (MISCELLANEOUS) ×2 IMPLANT
PADDING CAST ABS COTTON 3X4 (CAST SUPPLIES) IMPLANT
PADDING CAST COTTON 6X4 STRL (CAST SUPPLIES) ×3 IMPLANT
SPONGE T-LAP 18X18 ~~LOC~~+RFID (SPONGE) ×1 IMPLANT
STAPLER SKIN PROX 35W (STAPLE) IMPLANT
STOCKINETTE IMPERVIOUS LG (DRAPES) ×1 IMPLANT
STRIP CLOSURE SKIN 1/2X4 (GAUZE/BANDAGES/DRESSINGS) IMPLANT
SUCTION TUBE FRAZIER 10FR DISP (SUCTIONS) IMPLANT
SUT ETHILON 2 0 FS 18 (SUTURE) IMPLANT
SUT PDS AB 2-0 CT1 27 (SUTURE) IMPLANT
SUT VIC AB 0 CT1 27XBRD ANBCTR (SUTURE) IMPLANT
SUT VIC AB 2-0 CT1 TAPERPNT 27 (SUTURE) IMPLANT
SYR CONTROL 10ML LL (SYRINGE) IMPLANT
TOWEL GREEN STERILE (TOWEL DISPOSABLE) ×2 IMPLANT
TOWEL GREEN STERILE FF (TOWEL DISPOSABLE) ×2 IMPLANT
TUBE CONNECTING 12X1/4 (SUCTIONS) ×1 IMPLANT
UNDERPAD 30X36 HEAVY ABSORB (UNDERPADS AND DIAPERS) ×1 IMPLANT
WATER STERILE IRR 1000ML POUR (IV SOLUTION) ×2 IMPLANT
YANKAUER SUCT BULB TIP NO VENT (SUCTIONS) ×1 IMPLANT

## 2023-10-17 NOTE — Op Note (Signed)
 10/17/2023  9:49 AM  PATIENT:  Renee Reilly  1965-07-13 female   MEDICAL RECORD NUMBER: [id number]  PRE-OPERATIVE DIAGNOSIS:   1. SYMPTOMATIC HARDWARE LEFT FIBULA 2. SYMPTOMATIC HARDWARE LEFT TIBIA 3. S/P REPAIR OF SYNDESMOSIS  POST-OPERATIVE DIAGNOSIS:  1. SYMPTOMATIC HARDWARE LEFT FIBULA 2. SYMPTOMATIC HARDWARE LEFT TIBIA 3. S/P REPAIR OF SYNDESMOSIS  PROCEDURE:   REMOVAL OF DEEP IMPLANT LEFT FIBULA REMOVAL OF DEEP IMPLANT LEFT TIBIA MANUAL APPLICATION OF STRESS ANKLE SYNDESMOSIS UNDER FLUOROSCOPY  SURGEON:  Ozell DEL. Celena, M.D.  ASSISTANT:  None.  ANESTHESIA:  General.  COMPLICATIONS:  None.  TOURNIQUET: None.  SPECIMENS: None.  ESTIMATED BLOOD LOSS:  20 cc  DISPOSITION:  To PACU.  CONDITION:  Stable.  DELAY START OF DVT PROPHYLAXIS BECAUSE OF BLEEDING RISK: NO    BRIEF SUMMARY OF INDICATION FOR PROCEDURE:  Patient is a pleasant 58 y.o. who underwent plate fixation of a bimalleolar equivalent fracture and suture anchor repair of syndesmosis with subsequent healing. Despite conservative measures, hardware related symptoms have persisted and intensified, most recently on the . The patient's young age also predisposes to bone overgrowth that could significantly complicate or prevent subsequent removal. Therefore, I discussed with the patient the risks and benefits of surgical removal including infection, nerve or vessel injury, failure to alleviate symptoms, occult nonunion, re-fracture, DVT, PE, and multiple others. Consent was provided to proceed.   BRIEF SUMMARY OF PROCEDURE:  The patient was taken to the operating room after administration of 2 g of Ancef .  General anesthesia was induced. The left lower extremity was prepped and draped in usual sterile fashion.  No tourniquet was used during the procedure.  C-arm was brought in to confirm position of the hardware. I began with a stress view of syndesmosis under fluoroscopy which showed no lateral translation or  instability. I remade the old distal lateral incision and dissected sharply down to the plate on the fibula, elevating the soft tissues. I identified and removed all screws and then cut the suture to the suture button.   Next, with x-ray confirmation, I then made an incision medially over the tibia and carried dissection down to the soft tissue over the suture anchor at bone level. I mobilized it slightly with a tonsil then removed it with a needle driver and then extracted it along with the fiberwire suture atraumatically. Final x-rays confirmed removal of all hardware and a healed fracture.   Because of the patient's ankle pain and prior syndesmosis injury suggesting possible instability, a stress evaluation was performed once again after removal of hardware, consisting of external rotation of the ankle while holding it in the mortise view. Under live fluoro, I did not identify  any widening of the medial clear space nor widening of the syndesmotic interval. Consequently it was deemed stable.  The wounds were irrigated thoroughly and closed in standard fashion with vicryl and nylon. A sterile gently compressive dressing was applied.  The patient was taken to the PACU in stable condition.    PROGNOSIS: Patient will be weightbearing as tolerated with aggressive active and passive motion of the knee and ankle. Bleeding would be anticipated. She may change or remove dressing in 48 hours and shower. Patient will follow up in 10-14 days for removal of sutures.       Ozell DEL. Celena, M.D.

## 2023-10-17 NOTE — Anesthesia Procedure Notes (Signed)
 Procedure Name: Intubation Date/Time: 10/17/2023 8:36 AM  Performed by: Roslynn Waddell LABOR, CRNAPre-anesthesia Checklist: Patient identified, Emergency Drugs available, Suction available and Patient being monitored Patient Re-evaluated:Patient Re-evaluated prior to induction Oxygen Delivery Method: Circle System Utilized Preoxygenation: Pre-oxygenation with 100% oxygen Induction Type: IV induction Ventilation: Oral airway inserted - appropriate to patient size Laryngoscope Size: Mac and 3 Grade View: Grade I Tube type: Oral Tube size: 7.0 mm Number of attempts: 1 Airway Equipment and Method: Stylet and Oral airway Placement Confirmation: ETT inserted through vocal cords under direct vision, positive ETCO2 and breath sounds checked- equal and bilateral Secured at: 21 cm Tube secured with: Tape Dental Injury: Teeth and Oropharynx as per pre-operative assessment  Comments: Atraumatic induction/intubation. Edentulous upper and lower. Oral mucosa as per preop.

## 2023-10-17 NOTE — Transfer of Care (Signed)
 Immediate Anesthesia Transfer of Care Note  Patient: Renee Reilly  Procedure(s) Performed: REMOVAL, HARDWARE (Left)  Patient Location: PACU  Anesthesia Type:General  Level of Consciousness: awake, alert , and oriented  Airway & Oxygen Therapy: Patient Spontanous Breathing and Patient connected to face mask oxygen  Post-op Assessment: Report given to RN and Post -op Vital signs reviewed and stable  Post vital signs: Reviewed and stable  Last Vitals:  Vitals Value Taken Time  BP 152/75 10/17/23 09:40  Temp    Pulse 73 10/17/23 09:41  Resp 11 10/17/23 09:41  SpO2 94 % 10/17/23 09:41  Vitals shown include unfiled device data.  Last Pain:  Vitals:   10/17/23 0616  TempSrc:   PainSc: 8       Patients Stated Pain Goal: 3 (10/17/23 0600)  Complications: No notable events documented.

## 2023-10-17 NOTE — H&P (Signed)
 I have seen and examined the patient, I agree with the findings below by Renee Mt, PA-C, and I have directed the plan for treatment as noted.  The risks and benefits of hardware removal from the left ankle were discussed with the patient, including the possibility of infection, nerve injury, vessel injury, wound breakdown, arthritis, symptomatic hardware, DVT/ PE, loss of motion, occult nonunion, and need for further surgery among others.  These risks were acknowledged and consent was provided to proceed.   Renee VEAR Bruch, MD 10/17/2023 8:12 AM                        [facility] (OTS) Consult    Patient ID: Renee Reilly MRN: [id number] DOB/AGE: 02-May-1965 58 y.o.     HPI: Renee Reilly is an 58 y.o. female s/p ORIF L ankle fracture including syndesmosis 05/30/2023.  Patient has recovered well but still has some pain and stiffness in the fibula which is activity related.  She is tender over her syndesmotic screw.  No other issues noted.  She is recovering nicely.  Presents today for removal of her syndesmotic screw.  No restrictions postop.  Risks and benefits of surgery reviewed the patient she wished to proceed       Past Medical History:  Diagnosis Date   Anxiety     Arthritis     CAD (coronary artery disease), native coronary artery      coronary Ca score of 7.1 and mild CAD  of the RCA with soft plaque < 50% in the mid RCA   Carpal tunnel syndrome     Depression     Diabetes mellitus without complication (HCC)     Fatty liver      per patient   Heart murmur      had since she was a child per patient   Hypertension                 Past Surgical History:  Procedure Laterality Date   BALLOON DILATION N/A 04/22/2021    Procedure: BALLOON DILATION;  Surgeon: Cindie Carlin POUR, DO;  Location: [facility];  Service: Endoscopy;  Laterality: N/A;   BIOPSY   04/22/2021    Procedure: BIOPSY;  Surgeon: Cindie Carlin POUR, DO;  Location: [facility];   Service: Endoscopy;;   COLONOSCOPY WITH PROPOFOL  N/A 04/22/2021    Surgeon: Cindie Carlin POUR, DO; nonbleeding internal hemorrhoids.  Repeat in 10 years.   ESOPHAGOGASTRODUODENOSCOPY (EGD) WITH PROPOFOL  N/A 04/22/2021    Surgeon: Cindie Carlin POUR, DO;  Normal esophagus s/p empiric dilation, erythematous mucosa in the gastric body and antrum biopsied, normal examined duodenum.   ORIF ANKLE FRACTURE Left 05/30/2023    Procedure: OPEN REDUCTION INTERNAL FIXATION (ORIF) ANKLE FRACTURE;  Surgeon: Reilly Ozell, MD;  Location: [facility] OR;  Service: Orthopedics;  Laterality: Left;   SYNDESMOSIS REPAIR Left 05/30/2023    Procedure: REPAIR, SYNDESMOSIS, ANKLE;  Surgeon: Reilly Ozell, MD;  Location: [facility] OR;  Service: Orthopedics;  Laterality: Left;   TUBAL LIGATION                   Family History  Problem Relation Age of Onset   Cancer Mother          Liver cancer   Diabetes Brother     Cancer Other            Social History:  reports that she has been smoking cigarettes. She  has never used smokeless tobacco. She reports current alcohol use. She reports that she does not use drugs.   Allergies:  Allergies       Allergies  Allergen Reactions   Penicillins Anaphylaxis      Patient tolerates Ancef  -- administration on 05/30/2023        Medications: I have reviewed the patient's current medications.     Current Outpatient Medications  Medication Instructions   acetaminophen  (TYLENOL ) 1,300 mg, Every 8 hours PRN   aspirin 81 mg, Daily   Azelastine HCl 137 MCG/SPRAY SOLN 1 spray, Daily PRN   citalopram  (CELEXA ) 20 mg, Oral, Daily   fenofibrate  (TRICOR ) 145 mg, Daily   fluticasone -salmeterol (ADVAIR) 100-50 MCG/ACT AEPB 1 puff, Daily   gabapentin  (NEURONTIN ) 300 mg, 2 times daily   Jardiance 10 mg, Daily   lisinopril  (ZESTRIL ) 10 mg, Oral, Daily   metFORMIN  (GLUCOPHAGE ) 1,000 mg, Oral, 2 times daily with meals   methocarbamol (ROBAXIN) 500 mg, Daily   omeprazole  (PRILOSEC) 40 mg, Oral,  Daily   ondansetron  (ZOFRAN ) 4 MG tablet TAKE 1 TABLET BY MOUTH EVERY 8 HOURS AS NEEDED FOR NAUSEA FOR VOMITING   oxyCODONE  (ROXICODONE ) 5 mg, Oral, Every 6 hours PRN   pravastatin (PRAVACHOL) 10 mg, Daily   traZODone  (DESYREL ) 100 mg, Oral, At bedtime PRN   Vitamin D  (Ergocalciferol ) (DRISDOL) 50,000 Units, Oral, Weekly        Lab Results Last 48 Hours  No results found for this or any previous visit (from the past 48 hours).     Imaging Results (Last 48 hours)  No results found.     Intake/Output    None        ROS Left ankle pain  There were no vitals taken for this visit. Physical Exam Gen: NAD, appears well Lungs: unlabored Left Lower Extremity              Surgical wounds are well-healed             Ambulates with slight limp but overall much improved gait             No medial malleolus tenderness             No tenderness over fibula other than syndesmotic screw             Good ankle motion is noted             Distal motor and sensory function are intact     Assessment/Plan:   58 year old female status post ORIF left ankle fracture including syndesmotic repair with symptomatic hardware left syndesmosis   - Symptomatic hardware left ankle               OR for removal of syndesmotic screw               No restrictions postop               Weight-bear as tolerated postop, range of motion as tolerated postop               Risks and benefits reviewed with patient and she wished to proceed                 Outpatient surgery     Renee MICAEL Mt, PA-C [phone] (C) 09/27/2023, 2:29 PM   [facility] [address] [place] [address] [phone] [phone] (F)      After 5pm and on the weekends  please log on to Amion, go to orthopaedics and the look under the [facility] Call for the provider(s) on call. You can also call our office at [phone] and then follow the prompts to be connected to the call team.

## 2023-10-17 NOTE — Discharge Instructions (Addendum)
 Orthopaedic Trauma Service Discharge Instructions   General Discharge Instructions   WEIGHT BEARING STATUS: Weight-bear as tolerated left leg  RANGE OF MOTION/ACTIVITY: No motion restrictions left ankle.  Activity as tolerated.  Slowly increase activity  Bone health:   Review the following resource for additional information regarding bone health  [email or web address].cy  Wound Care: Daily wound care starting on 10/19/2023   Discharge Pin Site Instructions  Dress pins daily with Kerlix roll starting on POD 2. Wrap the Kerlix so that it tamps the skin down around the pin-skin interface to prevent/limit motion of the skin relative to the pin.  (Pin-skin motion is the primary cause of pain and infection related to external fixator pin sites).  Remove any crust or coagulum that may obstruct drainage with soap and water.  After POD 3, if there is no discernable drainage on the pin site dressing, the interval for change can by increased to every other day.  You may shower with the fixator, cleaning all pin sites gently with soap and water.  If you have a surgical wound this needs to be completely dry and without drainage before showering. Alternatively you can use a washcloth with soap and water and gently clean the injured extremity and external fixator, including all pinsites and surgical wounds   The extremity can be lifted by the fixator to facilitate wound care and transfers.  Notify the office/Doctor if you experience increasing drainage, redness, or pain from a pin site, or if you notice purulent (thick, snot-like) drainage. As we discussed pin tract infections are common in this is most likely as a result of mechanical irritation from the skin pin interface. Primary treatment is hygiene and cleaning with soap and water. If this does not resolve with regular cleaning contact the office  Discharge Wound Care Instructions  Do NOT apply any ointments, solutions  or lotions to pin sites or surgical wounds.  These prevent needed drainage and even though solutions like hydrogen peroxide kill bacteria, they also damage cells lining the pin sites that help fight infection.  Applying lotions or ointments can keep the wounds moist and can cause them to breakdown and open up as well. This can increase the risk for infection. When in doubt call the office.  Surgical incisions should be dressed daily.  If any drainage is noted, use one layer of adaptic or Mepitel, then gauze, Kerlix, and an ace wrap.  [email or web address][employer]cz [email or web address]/?pd_rd_[email or web address]&[email or web address]  [email or web address].[email or web address]  These dressing supplies should be available at local medical supply stores ([employer], [employer], etc). They are not usually carried at places like [facility], [facility], [facility], etc  Once the incision is completely dry and without drainage, it may be left open to air out.  Showering may begin 36-48 hours later.  Cleaning gently with soap and water.  Diet: as you were eating previously.  Can use over the counter stool softeners and bowel preparations, such as Miralax, to help with bowel movements.  Narcotics can be constipating.  Be sure to drink plenty of fluids  PAIN MEDICATION USE AND EXPECTATIONS  You have likely been given narcotic medications to help control your pain.  After a traumatic event that results in an fracture (broken bone) with or without surgery, it is ok to use narcotic pain medications to help control one's pain.  We understand that everyone responds to pain differently and each individual patient will be evaluated on a regular basis for the continued need for narcotic medications. Ideally, narcotic medication use should last  no more than 6-8 weeks (coinciding with  fracture healing).   As a patient it is your responsibility as well to monitor narcotic medication use and report the amount and frequency you use these medications when you come to your office visit.   We would also advise that if you are using narcotic medications, you should take a dose prior to therapy to maximize you participation.  IF YOU ARE ON NARCOTIC MEDICATIONS IT IS NOT PERMISSIBLE TO OPERATE A MOTOR VEHICLE (MOTORCYCLE/CAR/TRUCK/MOPED) OR HEAVY MACHINERY DO NOT MIX NARCOTICS WITH OTHER CNS (CENTRAL NERVOUS SYSTEM) DEPRESSANTS SUCH AS ALCOHOL   POST-OPERATIVE OPIOID TAPER INSTRUCTIONS: It is important to wean off of your opioid medication as soon as possible. If you do not need pain medication after your surgery it is ok to stop day one. Opioids include: Codeine, Hydrocodone(Norco, Vicodin), Oxycodone (Percocet, oxycontin ) and hydromorphone amongst others.  Long term and even short term use of opiods can cause: Increased pain response Dependence Constipation Depression Respiratory depression And more.  Withdrawal symptoms can include Flu like symptoms Nausea, vomiting And more Techniques to manage these symptoms Hydrate well Eat regular healthy meals Stay active Use relaxation techniques(deep breathing, meditating, yoga) Do Not substitute Alcohol to help with tapering If you have been on opioids for less than two weeks and do not have pain than it is ok to stop all together.  Plan to wean off of opioids This plan should start within one week post op of your fracture surgery  Maintain the same interval or time between taking each dose and first decrease the dose.  Cut the total daily intake of opioids by one tablet each day Next start to increase the time between doses. The last dose that should be eliminated is the evening dose.    STOP SMOKING OR USING NICOTINE PRODUCTS!!!!  As discussed nicotine severely impairs your body's ability to heal surgical and traumatic  wounds but also impairs bone healing.  Wounds and bone heal by forming microscopic blood vessels (angiogenesis) and nicotine is a vasoconstrictor (essentially, shrinks blood vessels).  Therefore, if vasoconstriction occurs to these microscopic blood vessels they essentially disappear and are unable to deliver necessary nutrients to the healing tissue.  This is one modifiable factor that you can do to dramatically increase your chances of healing your injury.    (This means no smoking, no nicotine gum, patches, etc)  DO NOT USE NONSTEROIDAL ANTI-INFLAMMATORY DRUGS (NSAID'S)  Using products such as Advil  (ibuprofen ), Aleve  (naproxen ), Motrin  (ibuprofen ) for additional pain control during fracture healing can delay and/or prevent the healing response.  If you would like to take over the counter (OTC) medication, Tylenol  (acetaminophen ) is ok.  However, some narcotic medications that are given for pain control contain acetaminophen  as well. Therefore, you should not exceed more than 4000 mg of tylenol  in a day if you do not have liver disease.  Also note that there are may OTC medicines, such as cold medicines and allergy medicines that my contain tylenol  as well.  If you have any questions about medications and/or interactions please ask your doctor/PA or your pharmacist.      ICE AND ELEVATE INJURED/OPERATIVE EXTREMITY  Using ice and elevating the injured extremity above your heart can help with swelling and pain control.  Icing in a pulsatile fashion, such as 20 minutes on and 20 minutes off, can be followed.    Do not place ice directly on skin. Make sure there is a barrier between to skin and the ice  pack.    Using frozen items such as frozen peas works well as the conform nicely to the are that needs to be iced.  USE AN ACE WRAP OR TED HOSE FOR SWELLING CONTROL  In addition to icing and elevation, Ace wraps or TED hose are used to help limit and resolve swelling.  It is recommended to use Ace wraps or  TED hose until you are informed to stop.    When using Ace Wraps start the wrapping distally (farthest away from the body) and wrap proximally (closer to the body)   Example: If you had surgery on your leg and you do not have a splint on, start the ace wrap at the toes and work your way up to the thigh        If you had surgery on your upper extremity and do not have a splint on, start the ace wrap at your fingers and work your way up to the upper arm  IF YOU ARE IN A SPLINT OR CAST DO NOT REMOVE IT FOR ANY REASON   If your splint gets wet for any reason please contact the office immediately. You may shower in your splint or cast as long as you keep it dry.  This can be done by wrapping in a cast cover or garbage back (or similar)  Do Not stick any thing down your splint or cast such as pencils, money, or hangers to try and scratch yourself with.  If you feel itchy take benadryl as prescribed on the bottle for itching  IF YOU ARE IN A CAM BOOT (BLACK BOOT)  You may remove boot periodically. Perform daily dressing changes as noted below.  Wash the liner of the boot regularly and wear a sock when wearing the boot. It is recommended that you sleep in the boot until told otherwise    Call office for the following: Temperature greater than 101F Persistent nausea and vomiting Severe uncontrolled pain Redness, tenderness, or signs of infection (pain, swelling, redness, odor or green/yellow discharge around the site) Difficulty breathing, headache or visual disturbances Hives Persistent dizziness or light-headedness Extreme fatigue Any other questions or concerns you may have after discharge  In an emergency, call 911 or go to an Emergency Department at a nearby hospital  HELPFUL INFORMATION  If you had a block, it will wear off between 8-24 hrs postop typically.  This is period when your pain may go from nearly zero to the pain you would have had postop without the block.  This is an abrupt  transition but nothing dangerous is happening.  You may take an extra dose of narcotic when this happens.  You should wean off your narcotic medicines as soon as you are able.  Most patients will be off or using minimal narcotics before their first postop appointment.   We suggest you use the pain medication the first night prior to going to bed, in order to ease any pain when the anesthesia wears off. You should avoid taking pain medications on an empty stomach as it will make you nauseous.  Do not drink alcoholic beverages or take illicit drugs when taking pain medications.  In most states it is against the law to drive while you are in a splint or sling.  And certainly against the law to drive while taking narcotics.  You may return to work/school in the next couple of days when you feel up to it.   Pain medication may make you constipated.  Below are a few solutions to try in this order: Decrease the amount of pain medication if you aren't having pain. Drink lots of decaffeinated fluids. Drink prune juice and/or each dried prunes  If the first 3 don't work start with additional solutions Take Colace - an over-the-counter stool softener Take Senokot - an over-the-counter laxative Take Miralax - a stronger over-the-counter laxative     CALL THE OFFICE WITH ANY QUESTIONS OR CONCERNS: [phone]   VISIT OUR WEBSITE FOR ADDITIONAL INFORMATION: [email or web address]

## 2023-10-18 ENCOUNTER — Encounter (HOSPITAL_COMMUNITY): Payer: Self-pay | Admitting: Orthopedic Surgery

## 2023-10-18 NOTE — Anesthesia Postprocedure Evaluation (Signed)
 Anesthesia Post Note  Patient: SHANICQUA COLDREN  Procedure(s) Performed: REMOVAL, HARDWARE (Left)     Patient location during evaluation: PACU Anesthesia Type: General Level of consciousness: awake and alert Pain management: pain level controlled Vital Signs Assessment: post-procedure vital signs reviewed and stable Respiratory status: spontaneous breathing, nonlabored ventilation, respiratory function stable and patient connected to nasal cannula oxygen Cardiovascular status: blood pressure returned to baseline and stable Postop Assessment: no apparent nausea or vomiting Anesthetic complications: no   No notable events documented.  Last Vitals:  Vitals:   10/17/23 1015 10/17/23 1030  BP: 127/79 133/79  Pulse: (!) 59 64  Resp: 14 12  Temp:  36.5 C  SpO2: 92% 95%    Last Pain:  Vitals:   10/17/23 1050  TempSrc:   PainSc: 3                  Aalyiah Camberos L [place] Nordquist

## 2023-10-20 ENCOUNTER — Other Ambulatory Visit (HOSPITAL_COMMUNITY): Payer: Self-pay

## 2023-11-14 ENCOUNTER — Other Ambulatory Visit (HOSPITAL_COMMUNITY): Payer: Self-pay

## 2023-11-14 ENCOUNTER — Encounter: Payer: Self-pay | Admitting: Gastroenterology

## 2023-11-14 ENCOUNTER — Ambulatory Visit (INDEPENDENT_AMBULATORY_CARE_PROVIDER_SITE_OTHER): Payer: MEDICAID | Admitting: Gastroenterology

## 2023-11-14 VITALS — BP 122/75 | HR 90 | Temp 98.2°F | Ht 65.0 in | Wt 191.8 lb

## 2023-11-14 DIAGNOSIS — R110 Nausea: Secondary | ICD-10-CM | POA: Insufficient documentation

## 2023-11-14 DIAGNOSIS — R109 Unspecified abdominal pain: Secondary | ICD-10-CM | POA: Insufficient documentation

## 2023-11-14 DIAGNOSIS — K219 Gastro-esophageal reflux disease without esophagitis: Secondary | ICD-10-CM | POA: Diagnosis not present

## 2023-11-14 DIAGNOSIS — R1084 Generalized abdominal pain: Secondary | ICD-10-CM

## 2023-11-14 DIAGNOSIS — R1319 Other dysphagia: Secondary | ICD-10-CM | POA: Insufficient documentation

## 2023-11-14 DIAGNOSIS — K5903 Drug induced constipation: Secondary | ICD-10-CM

## 2023-11-14 DIAGNOSIS — R1310 Dysphagia, unspecified: Secondary | ICD-10-CM

## 2023-11-14 DIAGNOSIS — K760 Fatty (change of) liver, not elsewhere classified: Secondary | ICD-10-CM | POA: Diagnosis not present

## 2023-11-14 DIAGNOSIS — K5900 Constipation, unspecified: Secondary | ICD-10-CM | POA: Insufficient documentation

## 2023-11-14 MED ORDER — LINACLOTIDE 290 MCG PO CAPS
290.0000 ug | ORAL_CAPSULE | Freq: Every day | ORAL | 11 refills | Status: DC
  Filled 2023-11-14: qty 30, 30d supply, fill #0

## 2023-11-14 MED ORDER — LINACLOTIDE 290 MCG PO CAPS: 290.0000 ug | ORAL_CAPSULE | Freq: Every day | ORAL | 11 refills | Status: AC

## 2023-11-14 MED ORDER — OMEPRAZOLE 40 MG PO CPDR
40.0000 mg | DELAYED_RELEASE_CAPSULE | Freq: Every day | ORAL | 11 refills | Status: DC
  Filled 2023-11-14: qty 30, 30d supply, fill #0

## 2023-11-14 MED ORDER — ONDANSETRON HCL 4 MG PO TABS: 4.0000 mg | ORAL_TABLET | Freq: Three times a day (TID) | ORAL | 3 refills | Status: AC | PRN

## 2023-11-14 MED ORDER — ONDANSETRON HCL 4 MG PO TABS
4.0000 mg | ORAL_TABLET | Freq: Three times a day (TID) | ORAL | 3 refills | Status: DC | PRN
  Filled 2023-11-14: qty 90, 30d supply, fill #0

## 2023-11-14 MED ORDER — OMEPRAZOLE 40 MG PO CPDR: 40.0000 mg | DELAYED_RELEASE_CAPSULE | Freq: Every day | ORAL | 11 refills | Status: AC

## 2023-11-14 NOTE — Patient Instructions (Signed)
 Refilled your omeprazole  and ondansetron .  Start Linzess  290mcg daily before breakfast for constipation. New RX sent to pharmacy.  Once you have seen the heart doctor, please let them know we are planning on upper endoscopy. We will need their notes faxed to [phone].   Upper endoscopy in the near future once you have been evaluated by cardiology.  Continue to follow with liver specialist for upcoming appointment.

## 2023-11-14 NOTE — Progress Notes (Signed)
 GI Office Note    Referring Provider: Kahoano, Haku K, MD Primary Care Physician:  Kahoano, Haku K, MD  Primary Gastroenterologist: Carlin POUR. Cindie, DO   Chief Complaint   Chief Complaint  Patient presents with   Dysphagia    Having problems swallowing. Needs a medications refill on omeprazole  and ondansetron .      History of Present Illness   Renee Reilly is a 58 y.o. female presenting today for refills. Last seen in 12/2022. She has history of chronic GERD, nausea, dysphagia, MASLD.   Discussed the use of AI scribe software for clinical note transcription with the patient, who gave verbal consent to proceed.   She has experienced worsening dysphagia over the past month, with food sometimes feeling stuck after swallowing. Particularly with meat and occasionally with other foods. She sometimes needs to manually massage her neck to push the food down. Significant acid reflux and heartburn are present, managed with omeprazole  taken in the morning and occasionally a second dose if symptoms persist.   Persistent nausea is present, which she attributes to the large number of medications taken simultaneously in the morning and at night. She takes ondansetron  tablets up to three times a day for relief. She ensures to take her medications with food to avoid nausea, as taking them on an empty stomach has previously caused sickness. Likely reflux and constipation contributing. Takes oxycodone  3-4 times per day for chronic pain, likely has some delayed gastric emptying due to medication and diabetes. She denies vomiting.  She experiences constipation, with bowel movements occurring every two to three days, often requiring straining. No blood is seen in the stool. Abdominal pain is present, particularly in the lower abdomen worse with constipation.   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 Concerns about  liver health are heightened due to her brother's history of cirrhosis, associated with obesity, diabetes, and alcohol use. She states she was told she would start medication when she gets to a stage four. Unfortunately records are unavailable to me for review.   She is scheduled to see a cardiologist, she is unsure why. She has history of heart murmur, which has been present since childhood. An EKG has been performed recently by her PCP. She denies chest pain with exertion. No significant edema.        Prior Data   U/s with elastography 03/2021: -hepatic steatosis -CBD 8mm favored reservoir effect post cholecystectomy.  -median kPa of 3.7  EGD 04/2021: -erythematous mucosa gastric body and antrum s/p bx reactive gastropathy, no h.pylori -normal duodenal bulb, first portion of duodenum and second portion of duodenum -consider esophageal manometry if symptoms not improved s/p dilation and increase in PPI  Colonoscopy 04/2021: -preparation of colon fair -nonbleeding internal hemorrhoids -stool in entire examined colon, lavage resulted in clearance with fair visualization -colonoscopy in 10 years  Medications   Current Outpatient Medications  Medication Sig Dispense Refill   acetaminophen  (TYLENOL ) 650 MG CR tablet Take 1,300 mg by mouth every 8 (eight) hours as needed for pain.     aspirin 81 MG chewable tablet Chew 81 mg by mouth daily.     Azelastine HCl 137 MCG/SPRAY SOLN Place 1 spray into both nostrils daily as needed (Congestion).     citalopram  (CELEXA ) 20 MG tablet Take 1 tablet (20 mg total) by mouth daily. 90 tablet 2   desvenlafaxine (PRISTIQ) 50 MG 24 hr tablet Take  25 mg by mouth at bedtime.     diazepam (VALIUM) 5 MG tablet Take 5 mg by mouth 2 (two) times daily as needed for anxiety.     fenofibrate  (TRICOR ) 145 MG tablet Take 145 mg by mouth daily.     fluticasone -salmeterol (ADVAIR) 100-50 MCG/ACT AEPB Inhale 1 puff into the lungs daily.     gabapentin  (NEURONTIN ) 300  MG capsule Take 300 mg by mouth 2 (two) times daily.     JARDIANCE 10 MG TABS tablet Take 10 mg by mouth daily.     lisinopril  (ZESTRIL ) 10 MG tablet Take 1 tablet by mouth once daily 90 tablet 0   metFORMIN  (GLUCOPHAGE ) 1000 MG tablet TAKE 1 TABLET BY MOUTH TWICE DAILY WITH A MEAL 180 tablet 0   methocarbamol (ROBAXIN) 500 MG tablet Take 500 mg by mouth daily. Additional 500 mg of needed for muscle spasm     omeprazole  (PRILOSEC) 40 MG capsule Take 1 capsule (40 mg total) by mouth daily. 30 capsule 11   ondansetron  (ZOFRAN ) 4 MG tablet TAKE 1 TABLET BY MOUTH EVERY 8 HOURS AS NEEDED FOR NAUSEA FOR VOMITING (Patient taking differently: Take 4 mg by mouth every 8 (eight) hours as needed.) 30 tablet 0   oxyCODONE  (ROXICODONE ) 5 MG immediate release tablet Take 1 tablet (5 mg total) by mouth every 6 (six) hours as needed for breakthrough pain. 20 tablet 0   pravastatin (PRAVACHOL) 10 MG tablet Take 10 mg by mouth daily.     traZODone  (DESYREL ) 100 MG tablet Take 1 tablet (100 mg total) by mouth at bedtime as needed for sleep. 30 tablet 0   Vitamin D , Ergocalciferol , (DRISDOL) 1.25 MG (50000 UNIT) CAPS capsule Take 50,000 Units by mouth once a week.     ketorolac  (TORADOL ) 10 MG tablet Take 1 tablet (10 mg total) by mouth every 6 (six) hours as needed. (Patient not taking: Reported on 11/14/2023) 20 tablet 0   No current facility-administered medications for this visit.    Allergies   Allergies as of 11/14/2023 - Review Complete 11/14/2023  Allergen Reaction Noted   Penicillins Anaphylaxis 02/11/2016     Past Medical History   Past Medical History:  Diagnosis Date   Anxiety    Arthritis    CAD (coronary artery disease), native coronary artery    coronary Ca score of 7.1 and mild CAD  of the RCA with soft plaque < 50% in the mid RCA   Carpal tunnel syndrome    Depression    Diabetes mellitus without complication (HCC)    Fatty liver    Heart murmur    had since she was a child per  patient   Hypertension     Past Surgical History   Past Surgical History:  Procedure Laterality Date   BALLOON DILATION N/A 04/22/2021   Procedure: BALLOON DILATION;  Surgeon: Cindie Carlin POUR, DO;  Location: [facility];  Service: Endoscopy;  Laterality: N/A;   BIOPSY  04/22/2021   Procedure: BIOPSY;  Surgeon: Cindie Carlin POUR, DO;  Location: [facility];  Service: Endoscopy;;   CHOLECYSTECTOMY     COLONOSCOPY WITH PROPOFOL  N/A 04/22/2021   Surgeon: Cindie Carlin POUR, DO; nonbleeding internal hemorrhoids.  Repeat in 10 years.   ESOPHAGOGASTRODUODENOSCOPY (EGD) WITH PROPOFOL  N/A 04/22/2021   Surgeon: Cindie Carlin POUR, DO;  Normal esophagus s/p empiric dilation, erythematous mucosa in the gastric body and antrum biopsied, normal examined duodenum.   HARDWARE REMOVAL Left 10/17/2023   Procedure: REMOVAL, HARDWARE;  Surgeon: Celena Sharper, MD;  Location: [facility] OR;  Service: Orthopedics;  Laterality: Left;   ORIF ANKLE FRACTURE Left 05/30/2023   Procedure: OPEN REDUCTION INTERNAL FIXATION (ORIF) ANKLE FRACTURE;  Surgeon: Celena Sharper, MD;  Location: [facility] OR;  Service: Orthopedics;  Laterality: Left;   SYNDESMOSIS REPAIR Left 05/30/2023   Procedure: REPAIR, SYNDESMOSIS, ANKLE;  Surgeon: Celena Sharper, MD;  Location: [facility] OR;  Service: Orthopedics;  Laterality: Left;   TUBAL LIGATION      Past Family History   Family History  Problem Relation Age of Onset   Cancer Mother        Liver cancer   Diabetes Brother    Cirrhosis Brother        had diabetes, obestiy, etoh abuse   Cancer Other     Past Social History   Social History   Socioeconomic History   Marital status: Widowed    Spouse name: Not on file   Number of children: Not on file   Years of education: Not on file   Highest education level: Not on file  Occupational History   Not on file  Tobacco Use   Smoking status: Every Day    Current packs/day: 0.25    Types: Cigarettes   Smokeless tobacco: Never  Vaping  Use   Vaping status: Never Used  Substance and Sexual Activity   Alcohol use: Yes    Comment: occas   Drug use: No   Sexual activity: Yes    Birth control/protection: Surgical  Other Topics Concern   Not on file  Social History Narrative   Not on file   Social Drivers of Health   Financial Resource Strain: Not on file  Food Insecurity: Not on file  Transportation Needs: Not on file  Physical Activity: Not on file  Stress: Not on file  Social Connections: Not on file  Intimate Partner Violence: Not on file    Review of Systems   General: Negative for anorexia, weight loss, fever, chills, fatigue, weakness. ENT: Negative for hoarseness, difficulty swallowing , nasal congestion. CV: Negative for chest pain, angina, palpitations, dyspnea on exertion, +peripheral edema. Chronic murmur Respiratory: Negative for dyspnea at rest, dyspnea on exertion, cough, sputum, wheezing.  GI: See history of present illness. GU:  Negative for dysuria, hematuria, urinary incontinence, urinary frequency, nocturnal urination.  Endo: Negative for unusual weight change.     Physical Exam   BP 122/75 (BP Location: Right Arm, Patient Position: Sitting, Cuff Size: Large)   Pulse 90   Temp 98.2 F (36.8 C) (Temporal)   Ht 5' 5 (1.651 m)   Wt 191 lb 12.8 oz (87 kg)   LMP  (LMP Unknown)   BMI 31.92 kg/m    General: Well-nourished, well-developed in no acute distress.  Eyes: No icterus. Mouth: Oropharyngeal mucosa moist and pink   Lungs: Clear to auscultation bilaterally.  Heart: Regular rate and rhythm, no  rubs or gallops. 2/6 SEM Abdomen: Bowel sounds are normal,   nondistended, no hepatosplenomegaly or masses,  no abdominal bruits or hernia , no rebound or guarding. Generalized abdominal pain Rectal: not performed Extremities: trace bilateral lower extremity edema. No clubbing or deformities. Neuro: Alert and oriented x 4   Skin: Warm and dry, no jaundice.   Psych: Alert and cooperative,  normal mood and affect.  Labs   Lab Results  Component Value Date   NA 134 (L) 10/17/2023   CL 104 10/17/2023   K 4.1 10/17/2023   CO2 23 10/17/2023  BUN 13 10/17/2023   CREATININE 0.69 10/17/2023   GFRNONAA >60 10/17/2023   CALCIUM  9.4 10/17/2023   ALBUMIN 3.6 10/17/2023   GLUCOSE 117 (H) 10/17/2023   Lab Results  Component Value Date   WBC 7.2 10/17/2023   HGB 13.7 10/17/2023   HCT 40.4 10/17/2023   MCV 92.9 10/17/2023   PLT 224 10/17/2023   Lab Results  Component Value Date   ALT 29 10/17/2023   AST 25 10/17/2023   ALKPHOS 52 10/17/2023   BILITOT 0.6 10/17/2023    Imaging Studies   DG Ankle Complete Left Result Date: 10/17/2023 CLINICAL DATA:  Elective surgery, hardware removal. EXAM: LEFT ANKLE COMPLETE - 3+ VIEW COMPARISON:  Radiograph 05/30/2023 FINDINGS: Two fluoroscopic spot views of the ankle submitted from the operating room. Imaging obtained during removal of hardware. Fluoroscopy time 10 seconds. Dose 0.3 mGy. IMPRESSION: Intraoperative fluoroscopy during hardware removal. Electronically Signed   By: Andrea Gasman M.D.   On: 10/17/2023 12:36   DG C-Arm 1-60 Min-No Report Result Date: 10/17/2023 Fluoroscopy was utilized by the requesting physician.  No radiographic interpretation.    Assessment/Plan:   GERD/Dysphagia Reflux controlled mostly, sometimes breakthrough symptoms in the evening. She may take additional omeprazole  when this occurs. She is having worsening dysphagia. Suspected delayed gastric emptying due to medications and diabetes, may be contributing. Previous dysphagia responded to EGD with dilation. - EGD/ED with Dr. Cindie. ASA 3, room 1,2.  I have discussed the risks, alternatives, benefits with regards to but not limited to the risk of reaction to medication, bleeding, infection, perforation and the patient is agreeable to proceed. Written consent to be obtained. -WE WILL WAIT UNTIL SHE SEES CARDIOLOGY NEXT MONTH, [facility].  -  Continue omeprazole  daily before breakfast.  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 More generalized on exam, to light palpation. ?some functional component.  -Linzess  daily.  -monitor abdominal pain, to see if alleviated.   Chronic nausea Persistent nausea possibly related to medication regimen and delayed gastric emptying +/- reflux/constipation - address constipation - zofran  4mg  tid prn  -discussed spacing out medications if able to decrease nausea potentially caused by taking numerous medications at once -eat small meals throughout the day, avoid overeating, fatty/greasy foods.    MASLD: -she is following with liver specialist at [facility] annually. We do not have records. She has upcoming appointment.  -advised she continue seeing someone for her liver, to monitor for significant progression. She is aware of potential medication available and states her provider said she is stage 3 and if she gets to stage 4 they would prescribe. This may be late in the course of disease to start these time of fibrosis medications.  -by our records, she had limited fibrosis on elastography in 2023. Her FIB4 score excludes advanced fibrosis Instructions for fatty liver: Recommend 1-2# weight loss per week until ideal body weight through exercise & diet. Low fat/cholesterol diet.   Avoid sweets, sodas, fruit juices, sweetened beverages like tea, etc. Gradually increase exercise from 15 min daily up to 1 hr per day 5 days/week. Limit alcohol use. -she will let us  know if she prefers to be managed here otherwise she will continue follow up with her current provider.           Sonny RAMAN. Ezzard, MHS, PA-C [facility]

## 2023-12-20 ENCOUNTER — Telehealth: Payer: Self-pay | Admitting: *Deleted

## 2023-12-20 NOTE — Telephone Encounter (Signed)
 Attempted to contact pt at both numbers listed, states your call can not be completed as dialed, please consult your directory and try call again.   Pt needs to have cardiology visit sent to our office for provider to look over prior to being scheduled for procedure.

## 2023-12-29 ENCOUNTER — Encounter: Payer: Self-pay | Admitting: *Deleted

## 2023-12-29 NOTE — Telephone Encounter (Signed)
 Attempted to contact pt at both numbers listed, states your call can not be completed as dialed, please consult your directory and try call again.  Letter mailed.

## 2024-01-29 ENCOUNTER — Other Ambulatory Visit (HOSPITAL_COMMUNITY): Payer: Self-pay | Admitting: Adult Medicine

## 2024-01-29 DIAGNOSIS — M8589 Other specified disorders of bone density and structure, multiple sites: Secondary | ICD-10-CM

## 2024-01-30 ENCOUNTER — Other Ambulatory Visit (HOSPITAL_COMMUNITY): Payer: Self-pay | Admitting: Adult Medicine

## 2024-01-30 DIAGNOSIS — Z1231 Encounter for screening mammogram for malignant neoplasm of breast: Secondary | ICD-10-CM

## 2024-01-30 NOTE — Telephone Encounter (Signed)
 Pt informed that needs to have cardiology OV notes faxed over for provider to review before we can get her scheduled. She states she will be over there tomorrow and will have them fax them over.

## 2024-02-09 ENCOUNTER — Other Ambulatory Visit (HOSPITAL_COMMUNITY): Payer: MEDICAID

## 2024-02-09 ENCOUNTER — Ambulatory Visit (HOSPITAL_COMMUNITY)
Admission: RE | Admit: 2024-02-09 | Discharge: 2024-02-09 | Disposition: A | Discharge status: Home / Self Care | Payer: MEDICAID | Source: Ambulatory Visit | Attending: Adult Medicine | Admitting: Adult Medicine

## 2024-02-09 DIAGNOSIS — M8589 Other specified disorders of bone density and structure, multiple sites: Secondary | ICD-10-CM | POA: Insufficient documentation

## 2024-02-09 DIAGNOSIS — Z1231 Encounter for screening mammogram for malignant neoplasm of breast: Secondary | ICD-10-CM | POA: Diagnosis present
